# Patient Record
Sex: Male | Born: 1995 | Race: White | Marital: Single | Smoking: Never smoker
Health system: Northeastern US, Academic
[De-identification: ages and names within clinical notes are randomized; demographics above are authoritative.]

## PROBLEM LIST (undated history)

## (undated) DIAGNOSIS — G801 Spastic diplegic cerebral palsy: Secondary | ICD-10-CM

## (undated) DIAGNOSIS — K649 Unspecified hemorrhoids: Secondary | ICD-10-CM

## (undated) HISTORY — DX: Spastic diplegic cerebral palsy: G80.1

## (undated) HISTORY — DX: Unspecified hemorrhoids: K64.9

---

## 2016-06-05 ENCOUNTER — Encounter: Payer: Self-pay | Admitting: Gastroenterology

## 2016-09-16 ENCOUNTER — Inpatient Hospital Stay: Admit: 2016-09-16 | Discharge: 2016-09-16 | Disposition: A | Discharge status: Home / Self Care | Payer: Self-pay

## 2017-05-19 ENCOUNTER — Encounter: Payer: Self-pay | Admitting: Gastroenterology

## 2017-08-20 ENCOUNTER — Telehealth: Payer: Self-pay

## 2017-08-20 ENCOUNTER — Encounter: Payer: Self-pay | Admitting: Gastroenterology

## 2017-08-20 NOTE — Telephone Encounter (Signed)
[facility]. Placed on referral table.

## 2017-12-03 ENCOUNTER — Other Ambulatory Visit
Admission: RE | Admit: 2017-12-03 | Discharge: 2017-12-03 | Disposition: A | Discharge status: Home / Self Care | Payer: Commercial Managed Care - PPO | Source: Ambulatory Visit | Attending: Neurology | Admitting: Neurology

## 2017-12-03 ENCOUNTER — Encounter: Payer: Self-pay | Admitting: Neurology

## 2017-12-03 ENCOUNTER — Ambulatory Visit: Payer: Commercial Managed Care - PPO | Attending: Neurology | Admitting: Neurology

## 2017-12-03 VITALS — BP 140/90 | HR 69 | Ht 60.0 in | Wt 126.0 lb

## 2017-12-03 DIAGNOSIS — G20 Parkinson's disease: Secondary | ICD-10-CM

## 2017-12-03 LAB — CBC AND DIFFERENTIAL
Baso # K/uL: 0.1 10*3/uL (ref 0.0–0.1)
Basophil %: 1 %
Eos # K/uL: 0.2 10*3/uL (ref 0.0–0.5)
Eosinophil %: 2.6 %
Hematocrit: 51 % (ref 40–51)
Hemoglobin: 17 g/dL (ref 13.7–17.5)
IMM Granulocytes #: 0 10*3/uL
IMM Granulocytes: 0.2 %
Lymph # K/uL: 1.5 10*3/uL (ref 1.3–3.6)
Lymphocyte %: 19 %
MCH: 29 pg/cell (ref 26–32)
MCHC: 33 g/dL (ref 32–37)
MCV: 88 fL (ref 79–92)
Mono # K/uL: 0.4 10*3/uL (ref 0.3–0.8)
Monocyte %: 4.7 %
Neut # K/uL: 5.8 10*3/uL — ABNORMAL HIGH (ref 1.8–5.4)
Nucl RBC # K/uL: 0 10*3/uL (ref 0.0–0.0)
Nucl RBC %: 0 /100 WBC (ref 0.0–0.2)
Platelets: 227 10*3/uL (ref 150–330)
RBC: 5.8 MIL/uL (ref 4.6–6.1)
RDW: 12.5 % (ref 11.6–14.4)
Seg Neut %: 72.5 %
WBC: 8 10*3/uL (ref 4.2–9.1)

## 2017-12-03 LAB — COMPREHENSIVE METABOLIC PANEL
ALT: 18 U/L (ref 0–50)
AST: 20 U/L (ref 0–50)
Albumin: 5.3 g/dL — ABNORMAL HIGH (ref 3.5–5.2)
Alk Phos: 72 U/L (ref 40–130)
Anion Gap: 13 (ref 7–16)
Bilirubin,Total: 0.3 mg/dL (ref 0.0–1.2)
CO2: 28 mmol/L (ref 20–28)
Calcium: 9.9 mg/dL (ref 9.3–10.5)
Chloride: 100 mmol/L (ref 96–108)
Creatinine: 0.8 mg/dL (ref 0.67–1.17)
GFR,Black: 147 *
GFR,Caucasian: 127 *
Glucose: 92 mg/dL (ref 60–99)
Lab: 15 mg/dL (ref 6–20)
Potassium: 4.1 mmol/L (ref 3.3–5.1)
Sodium: 141 mmol/L (ref 133–145)
Total Protein: 8.1 g/dL — ABNORMAL HIGH (ref 6.3–7.7)

## 2017-12-03 LAB — CERULOPLASMIN: Ceruloplasmin: 25 mg/dL (ref 15–30)

## 2017-12-03 LAB — T4, FREE: Free T4: 1.5 ng/dL (ref 0.9–1.7)

## 2017-12-03 LAB — TSH: TSH: 0.9 u[IU]/mL (ref 0.27–4.20)

## 2017-12-03 LAB — VITAMIN B12: Vitamin B12: 1396 pg/mL — ABNORMAL HIGH (ref 232–1245)

## 2017-12-03 LAB — SEDIMENTATION RATE, AUTOMATED: Sedimentation Rate: 7 mm/hr (ref 0–15)

## 2017-12-04 ENCOUNTER — Encounter: Payer: Self-pay | Admitting: Neurology

## 2017-12-04 LAB — ANTI RNP/SMITH
Anti-RNP: <0.2 AI (ref 0.0–0.9)
Anti-Smith: <0.2 AI (ref 0.0–0.9)
SM/RNP AB: <0.2 AI (ref 0.0–0.9)

## 2017-12-04 LAB — ANTI-SSA/SSB
Anti-LA/SS-B: <0.2 AI (ref 0.0–0.9)
Anti-RO/SS-A: <0.2 AI (ref 0.0–0.9)

## 2017-12-04 LAB — CYCLIC CITRULLINATED PEPTIDE: Cyclic Citrullin Peptide Ab: <0.5 U/mL (ref 0.0–2.9)

## 2017-12-04 LAB — RHEUMATOID FACTOR,SCREEN: Rheumatoid Factor: <10 IU/mL

## 2017-12-04 LAB — ANCA SCREEN: ANCA Screen: NEGATIVE

## 2017-12-04 LAB — UNABLE TO PERFORM ADD-ON TESTING 1

## 2017-12-04 LAB — ANTI DS-DNA AB: dsDNA Ab: <1 IU/mL (ref 0–4)

## 2017-12-04 LAB — ANTINUCLEAR ANTIBODY SCREEN: ANA Screen: NEGATIVE

## 2017-12-04 NOTE — Progress Notes (Signed)
NEUROLOGY Clinic Consult Note    Referring Provider: Molinda Bailiff, MD    Reason for consult: chronic pain  History of Present Illness:      Thank you for referring Corey Rodriguez for evaluation at my [facility] on 12/04/2017    Corey Rodriguez is a 22 y.o.  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  Nonetheless, he apparently had fairly normal development per his mother, with minimally delayed ambulation and speech.  He did partake in special education, but graduated high school and obtained an associate two-year college degree.  At his baseline he has been ambulatory without aid, with non-impairing leg spasticity and mild dysarthria.  His VP shunt has been revised 3 times, last in 2010 at [facility]).    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  He describes achy, burning, and stabbing pain in all 4 limbs, as well as in his neck and thoracic/lumbar regions.  The pain is constant throughout the day, exacerbating by any limb movement and almost all physical activity, especially walking.  He has no involvement of face or head, and does not have joint pain.  His muscles are not tender to his touch.      He does not note any change of his chronic leg spasticity, does not think his arms are particularly siff.  He does not describe hyperekplexia or any limb spasms.  There are no other sensory sx (paresthesias,  numbness), urinary/bowel sx, new muscle weakness, new reduced manual dexterity.  He has had very impaired sleep due to pain.  Deniies skin changes, vision changes, dry mouth/eyes, ulcers of mouth/genitals.    He is on sumatriptan 100 mg prn for once weekly episodicmigraine, characterized by diffuse throbbing pain, w/o focal aura and w/o nausea, photophobia,  phonophobia, or autonomic sx.  He also has hx of chronic upper limb tremor, which impairs his writing, holding liquids, and not present at rest.  He formerly used alcohol heavily, but noted no effect of alcohol on tremor.  He apparently had been treated in the past with propranolol, w/o much benefit. His MGM had tremor at a late age, but no other fhx of tremor, parkinson's.  He is not seeking tx for tremor today.    Regarding pain, he has been treated with several agents, including amitriptyline (no help), medical marijuana, CBD, gabapentin, cymbalta.  Also has been treated with abilify for mood, but this was stopped 2 months ago.  Has never been on venlafaxine, pregabalin.  Currently still on gabapentin, cymbalta, and MM.          PMH  Past Medical History:   Diagnosis Date    Hemorrhoids     Spastic cerebral palsy    vitamin d deficiency    PSH  Past Surgical History:   Procedure Laterality Date    VENTRICULOPERITONEAL SHUNT, 3 revisions           Medications:   Prior to Admission medications    Medication Sig Start Date End Date Taking? Authorizing Provider   DULoxetine (CYMBALTA) 60 MG capsule  11/10/17   [provider]   ERGOCALCIFEROL 50000 units capsule Take 1 capsule by mouth once a week 11/10/17   [provider]   GABAPENTIN 800 MG tablet  Take 800 mg by mouth 3 times daily 11/04/17   [provider]              ALLERGIES:    No Known Allergies (drug, envir, food or latex)         FH  Family History   Problem Relation Age of Onset    High cholesterol Mother     Migraines Father     High cholesterol Father     Tremors Maternal Grandmother     Migraines Paternal Grandmother          SOCIAL HISTORY  Social History     Social History    Marital status: Single     Spouse name: N/A    Number of children: N/A    Years of education: N/A     Social History Main Topics    Smoking status: Never Smoker    Smokeless tobacco: Never Used    Alcohol use No      Comment: He drank  heavily for 3-4 years from [age], stopped 2018.    Drug use: Unknown     Types: Marijuana    Sexual activity: Not Asked     Other Topics Concern    None     Social History Narrative    None       ROS       Review of Systems:    CONSTITUTIONAL: Appetite good, no fevers, night sweats or weight loss  EYES: No visual changes, no eye pain; no dry eyes/dry mouth  ENT: bilateral tinnitus, but no hl.  CV: No chest pain, shortness of breath or peripheral edema  RESPIRATORY: No cough, wheezing or dyspnea  GI: hx of hemorrhoids w/BRBPR.  No nausea/vomiting, abdominal pain, or change in bowel habits  GU: No dysuria, urgency or incontinence  MS: No joint pain/swelling.  SKIN: No rashes or ulcers  PSYCH: hx of depression  ENDOCRINE: No diabetes or thyroid disease  HEME/LYMPH: No easy bleeding/bruising or swollen nodes          Physical Exam  BP 140/90    Pulse 69    Ht 1.524 m (5')    Wt 57.2 kg (126 lb)    BMI 24.61 kg/m     Examination:  General: WD/WN male, NAD.  HEENT: not macrocephalic.  No obvious k-f rings  Neck:  ?extensor posturing.  Diffuse trigger points of muscles in all 4 limbs.    Neurologic Examination:      Mental status: awake, alert and fully oriented.  No language deficit.  Follows simple, left-right, multistepped commands.  No apraxia or neglect.  Attention, concentration and memory are grossly intact.  Higher cognitive functions are generally normal.      Cranial nerves: Pupils 4/4 to 2/2.   There is no afferent pupillary defect.  Visual fields are full.  Visual acuity  not tested.  Fundi sharp without papilledema or hemorrhage.  Extraocular movements full without nystagmus.  Facial sensation  intact bilaterally to PP/LT  in all three trigeminal divisions.  +facial hypomimia.Marland Kitchen  Hearing is intact to finger rub bilaterally.  Palate/uvula with midline elevation.  Gag intact.  Shoulder shrug  5/5. Tongue midline without atrophy, weakness, or fasciculations.  Mild pseudobulbar dysarhria.    Motor:      UE  (R/L)- SA 5/5  EF 5/5  EE 5/5 WE 5/5  WF 5/5  FE 4/4 FF 5/5  Intr 4/4.  I    LE (R/L)-  HF 5/5  KE 5/5 KF 5/5 FDF 5/5  FPF 5/5.  Mild bilateral UE postural tremor, high frequency/low amplitude.  No orbiting.  Reduced amplitude finger tapping bilaterally.  THere is mild upper limb cogwheeling and also mild bilateral LE spasticity.      No provoked mmuscle spasms, no hyperekplexia.    Sensory:  intact to pain, light touch, vibration, and proprioception.  Romberg negative. There is no extinction to double simultaneous stimulation.      Gait spastic, mildly paraparetic.  Able to tandem with difficulty.    Coordination : no ataxia w/ finger-nose-finger, heel-to-shin and rapid alternating movements bilaterally.  Did not assess handwriting/spiral.    Reflexes: sdiffusely brisk, unsustained ankle clonus bilaterally, bilateral Babinskis.          Labs:            Imaging:            IMPRESSION    I do not think his chronic pain is neuroogic in origin, and there are features suggestive of fibromyalgia (multiple trigger points, poor sleep, mood dysphoria), although this is unusual for a young man.  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  I suspect this is all related to his longstanding static encephalopathy (he does not c/o of gait change, new limb stiffness) and appears to have lonstanding essential tremor.  However, on limited notes from his neuroogist, do not see parkinsonian signs on exam.  He was on abilify briefly few months, he says), and this was stopped about 2 months ago.  Suppose parkinsonsm may still be drug-related (his chronic brain disease places him at more risk for drug-induced parkinsonism, which may take months to resolve).  Nonetheless will get testing for Wilson's (serum copper/ceruloplasmin, 24-hour urinary copper).   There is no fhx of WD, but this is autosomal recessive.    I do not think parkinsonism explains his  pain.  There are no features of stiff person syndrome (hyperekplexia, provoked spasms), though he does have some cervical lordosis.     He also has essential tremor.  Can try either propranolol or primidone but this seems less imprtant to him right now.  He does not have any sx/signs of peripheral neuropathy.    RECOMMENDATIONS:    Getting autoimmune labs, including  anti-GAD65/ampiphysin.  He is scheduled for brain MRI through his neurologis in [place].  Will get those images.  No intervention made today.  Reassess 6-8 weeks.      Thank you again for allowing me to participate in the care of Corey Rodriguez .   I can be reached at [email or web address] or [phone] with questions.

## 2017-12-05 ENCOUNTER — Encounter: Payer: Self-pay | Admitting: Gastroenterology

## 2017-12-05 LAB — COPPER, SERUM: Copper: 121 ug/dL (ref 70–140)

## 2017-12-06 ENCOUNTER — Inpatient Hospital Stay: Admit: 2017-12-06 | Discharge: 2017-12-06 | Disposition: A | Discharge status: Home / Self Care | Payer: Self-pay

## 2017-12-10 LAB — ENCEPHALOPATHY-AUTOIMMUNE EVAL,SER
AChR Ganglionic Neuronal: 0 nmol/L (ref <=0.02)
AMPA-R AB CBBA: NEGATIVE
Acetylcholine Rec Bind: 0 nmol/L (ref <=0.02)
Amphiphysin AB: NEGATIVE titer
CASPR2-IgG CBA,S: NEGATIVE
CRMP-5 IgG: NEGATIVE titer
GABA-B-R AB CBA: NEGATIVE
Glial Nuclear,Type 1: NEGATIVE titer
Glutamic Acid Decarboxylase AB: 0 nmol/L (ref <=0.02)
LGl1-IgG CBA,S: NEGATIVE
N-Type CA Channel Ab: 0 nmol/L (ref <=0.03)
NMDA-R AB CBA: NEGATIVE
NNA-1: NEGATIVE titer
NNA-2: NEGATIVE titer
NNA-3: NEGATIVE titer
Neuronal (V-G)K+ Channel: 0 nmol/L (ref <=0.02)
P/Q Type CA Channel AB: 0 nmol/L (ref <=0.02)
PCA Type 2: NEGATIVE titer
PCA Type TR: NEGATIVE titer
PCA, Type 1: NEGATIVE titer

## 2018-01-21 ENCOUNTER — Encounter: Payer: Self-pay | Admitting: Neurology

## 2018-01-21 ENCOUNTER — Ambulatory Visit: Payer: Commercial Managed Care - PPO | Attending: Neurology | Admitting: Neurology

## 2018-01-21 VITALS — BP 132/70 | HR 103 | Ht 60.0 in | Wt 135.0 lb

## 2018-01-21 DIAGNOSIS — F329 Major depressive disorder, single episode, unspecified: Secondary | ICD-10-CM

## 2018-01-21 DIAGNOSIS — M797 Fibromyalgia: Secondary | ICD-10-CM

## 2018-01-21 DIAGNOSIS — G20 Parkinson's disease: Secondary | ICD-10-CM

## 2018-01-21 DIAGNOSIS — F32A Depression, unspecified: Secondary | ICD-10-CM

## 2018-01-21 NOTE — Progress Notes (Signed)
NEUROLOGY CLINIC FOLLOW-UP NOTE    Reason for visit:    Interval History    [name] returns for follow-up today.  Continues to c/o whle body achy>burning pain.  His migraines are well controlled.  He does not c/o limb stiffness, change in his longstanding tremor, new speech or gait sx.  He has had no falls.    Unfortunately, he was admitted to [facility] with suidcidal ideations in June, primarily because of his pain.  Made several med changes.  He has no suicidal ideation now.  He is following with psychiatrist in [place].  Has had depression for about 2 years, since he developed his pain.    Labs here from May were all normal, including annti-GAD65.   Did not receive any resuslts of 24-hour urinary copper.  PMH:  Past Medical History:   Diagnosis Date    Hemorrhoids     Spastic cerebral palsy        .Meds:      ROS   A 16 point ROS was reviewed with patient and was otherwise unremarkable, save for the following:    as per hpi          Physical Exam  BP 132/70    Pulse 103    Ht 1.524 m (5')    Wt 61.2 kg (135 lb)    BMI 26.37 kg/m      General exam is unremarkable.  No obvious KF rings.  He has diffuse trigger points in muscles of all 4 limbs.    Neurological Examination:    Mental status:  fully alert and oriented; language intact.  Grossly normal cognition.  Mood/affect depressed.  NO SI.      Cranial nerves:  significant for facial hypomimia.    Motor:  power 5/5 throughout; there is mild cogwheeling of both upper limbs, L>R.  No resting tremor.  Postual tremor seems less pronounced today.  There is no orbiting.  Finger taps are bilaterally reduced.  There is very mild bradykinesia.     Sensory:  Vibration 21-22 sec at greeat toes, 21 sec at idex fingers.  Romberg negative.    Gait:  spastic, with reduced arm swing bilaterally.  He turns well, no en bloc.  There is 1-step retropulsion on pull test.    Coordination:  FNF is non-ataxic.    DTR:  diffusely brisk with bilateral  Babinskis.          Labs:      Hospital Outpatient Visit on 12/03/2017   Component Date Value Ref Range Status    WBC 12/03/2017 8.0  4.2 - 9.1 THOU/uL Final    RBC 12/03/2017 5.8  4.6 - 6.1 MIL/uL Final    Hemoglobin 12/03/2017 17.0  13.7 - 17.5 g/dL Final    Hematocrit 12/03/2017 51  40 - 51 % Final    MCV 12/03/2017 88  79 - 92 fL Final    MCH 12/03/2017 29  26 - 32 pg/cell Final    MCHC 12/03/2017 33  32 - 37 g/dL Final    RDW 12/03/2017 12.5  11.6 - 14.4 % Final    Platelets 12/03/2017 227  150 - 330 THOU/uL Final    Seg Neut % 12/03/2017 72.5  % Final    Lymphocyte % 12/03/2017 19.0  % Final    Monocyte % 12/03/2017 4.7  % Final    Eosinophil % 12/03/2017 2.6  % Final    Basophil % 12/03/2017 1.0  % Final  Neut # K/uL 12/03/2017 5.8* 1.8 - 5.4 THOU/uL Final    Lymph # K/uL 12/03/2017 1.5  1.3 - 3.6 THOU/uL Final    Mono # K/uL 12/03/2017 0.4  0.3 - 0.8 THOU/uL Final    Eos # K/uL 12/03/2017 0.2  0.0 - 0.5 THOU/uL Final    Baso # K/uL 12/03/2017 0.1  0.0 - 0.1 THOU/uL Final    Nucl RBC % 12/03/2017 0.0  0.0 - 0.2 /100 WBC Final    Nucl RBC # K/uL 12/03/2017 0.0  0.0 - 0.0 THOU/uL Final    IMM Granulocytes # 12/03/2017 0.0  THOU/uL Final    IMM Granulocytes 12/03/2017 0.2  % Final    Sodium 12/03/2017 141  133 - 145 mmol/L Final    Potassium 12/03/2017 4.1  3.3 - 5.1 mmol/L Final    Chloride 12/03/2017 100  96 - 108 mmol/L Final    CO2 12/03/2017 28  20 - 28 mmol/L Final    Anion Gap 12/03/2017 13  7 - 16 Final    UN 12/03/2017 15  6 - 20 mg/dL Final    Creatinine 12/03/2017 0.80  0.67 - 1.17 mg/dL Final    GFR,Caucasian 12/03/2017 127  * Final    GFR,Black 12/03/2017 147  * Final    Glucose 12/03/2017 92  60 - 99 mg/dL Final    Calcium 12/03/2017 9.9  9.3 - 10.5 mg/dL Final    Total Protein 12/03/2017 8.1* 6.3 - 7.7 g/dL Final    Albumin 12/03/2017 5.3* 3.5 - 5.2 g/dL Final    Bilirubin,Total 12/03/2017 0.3  0.0 - 1.2 mg/dL Final    AST 12/03/2017 20  0 - 50 U/L Final     ALT 12/03/2017 18  0 - 50 U/L Final    Alk Phos 12/03/2017 72  40 - 130 U/L Final    TSH 12/03/2017 0.90  0.27 - 4.20 uIU/mL Final    Free T4 12/03/2017 1.5  0.9 - 1.7 ng/dL Final    Vitamin B12 12/03/2017 1396* 232 - 1,245 pg/mL Final    Rheumatoid Factor 12/03/2017 <10  IU/mL Final    Sedimentation Rate 12/03/2017 7  0 - 15 mm/hr Final    ANA Screen 12/03/2017 NEG  NEGATIVE Final    Anti-RNP 12/03/2017 <0.2  0.0 - 0.9 AI Final    Anti-Smith 12/03/2017 <0.2  0.0 - 0.9 AI Final    SM/RNP AB 12/03/2017 <0.2  0.0 - 0.9 AI Final    dsDNA Ab 12/03/2017 <1  0 - 4 IU/mL Final    ANCA Screen 12/03/2017 NEG  NEGATIVE Final    Anti-RO/SS-A 12/03/2017 <0.2  0.0 - 0.9 AI Final    Anti-LA/SS-B 12/03/2017 <0.2  0.0 - 0.9 AI Final    Cyclic Citrullin Peptide Ab 12/03/2017 <0.5  0.0 - 2.9 U/mL Final    Interpretation,ENCES 12/03/2017 see below   Final    NMDA-R AB CBA 12/03/2017 Negative  Negative Final    Neuronal (V-G)K+ Channel 12/03/2017 0.00  <=0.02 nmol/L Final    LGl1-IgG CBA,S 12/03/2017 Negative  Negative Final    CASPR2-IgG CBA,S 12/03/2017 Negative  Negative Final    Glutamic Acid Decarboxylase AB 12/03/2017 0.00  <=0.02 nmol/L Final    GABA-B-R AB CBA 12/03/2017 Negative  Negative Final    AMPA-R AB CBBA 12/03/2017 Negative  Negative Final    NNA-1 12/03/2017 Negative  <1:240 titer Final    NNA-2 12/03/2017 Negative  <1:240 titer Final    Reflex Added 12/03/2017 None.  Final    NNA-3 12/03/2017 Negative  <1:240 titer Final    Glial Nuclear,Type 1 12/03/2017 Negative  <1:240 titer Final    PCA, Type 1 12/03/2017 Negative  <1:240 titer Final    PCA Type 2 12/03/2017 Negative  <1:240 titer Final    PCA Type TR 12/03/2017 Negative  <1:240 titer Final    Amphiphysin AB 12/03/2017 Negative  <1:240 titer Final    N-Type CA Channel Ab 12/03/2017 0.00  <=0.03 nmol/L Final    P/Q Type CA Channel AB 12/03/2017 0.00  <=0.02 nmol/L Final    Acetylcholine Rec Bind 12/03/2017 0.00  <=0.02 nmol/L  Final    AChR Ganglionic Neuronal 12/03/2017 0.00  <=0.02 nmol/L Final    CRMP-5 IgG 12/03/2017 Negative  <1:240 titer Final    Copper 12/03/2017 121  70 - 140 ug/dL Final    Ceruloplasmin 12/03/2017 25  15 - 30 mg/dL Final    Unable to Perform Add-on Testing 12/03/2017 SEE COMMENT   Final         Imaging:            IMPRESSION    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  I do not think this is related to his pain.  He does not appear to have dysfunction from parkinsonism.  He has longstanding spastic CP for which he has had excellent adaptation.  Based on his pain hx and multiple muscle trigger points, he does seem to have fibromyalgia. His depression appears to be related to this.  I think venlafaxine dosage shold be increased to address both his mood and pain, and will defer this to  his PCP and psychiatrist.  I will f/u on his 24-hour urinary copper and recent brain mri.  His episodic migraine responds to sumatriptan.          RECOMMENDATIONS:      f/u in six months to monitor parkinsonism.        Thank you again for allowing me to participate in the care of Corey Rodriguez .

## 2018-01-21 NOTE — Patient Instructions (Signed)
1.  I will speak with you when I receive your brain MRI and 24-hour urine results.  2.  Have your primary refer you to rheumatologist for fibromyalgia.  3.  Follow up with me in six months.  4.  Would suggest tryiing to increase venlafaxine to 75 mg.spastic

## 2018-01-29 ENCOUNTER — Encounter: Payer: Self-pay | Admitting: Neurology

## 2018-01-29 NOTE — Telephone Encounter (Signed)
Error

## 2018-02-02 ENCOUNTER — Telehealth: Payer: Self-pay | Admitting: Neurology

## 2018-02-02 NOTE — Telephone Encounter (Signed)
Received Copper, Urine lab collected 12/05/17. Placed in Lindsay's mailbox for review.

## 2018-02-02 NOTE — Telephone Encounter (Signed)
Scanned into Dr. Samkoffs eFolder and sent to 24 hour scanning.

## 2018-02-13 ENCOUNTER — Telehealth: Payer: Self-pay | Admitting: Neurology

## 2018-02-13 NOTE — Telephone Encounter (Signed)
-----   Message from Elroy ChannelLawrence M Samkoff, MD sent at 01/21/2018  8:37 AM EDT -----  Victorino DikeHello    Braelyn also had a recent brain MRI through [facility] in [place]..  Can we get images and report?    THanks.    LS

## 2018-02-13 NOTE — Telephone Encounter (Signed)
Call[facility] and [facility] with [facility]- they are sending this MRI along with MRI from 2018 on disk (and the reports)

## 2018-02-19 ENCOUNTER — Telehealth: Payer: Self-pay | Admitting: Neurology

## 2018-02-19 NOTE — Telephone Encounter (Signed)
AC1 received cd for scanning  Taking to [facility] room G-[id number]

## 2018-02-22 ENCOUNTER — Other Ambulatory Visit: Payer: Self-pay

## 2018-02-22 ENCOUNTER — Other Ambulatory Visit: Payer: Self-pay | Admitting: Gastroenterology

## 2018-02-24 NOTE — Telephone Encounter (Signed)
CD returned to patient by mail after scanning complete.

## 2018-07-24 ENCOUNTER — Encounter: Payer: Self-pay | Admitting: Neurology

## 2018-07-24 NOTE — Telephone Encounter (Signed)
Left second voicemail and mailed letter as 1/8 is approaching

## 2018-07-27 NOTE — Telephone Encounter (Signed)
Patient is no longer being seen by the [facility]. He is no longer in the [place].

## 2018-07-27 NOTE — Telephone Encounter (Addendum)
Another voicemail left 1/6. Also left a voicemail for his mother, Clydie Braun as she is listed in the [place].

## 2018-07-29 ENCOUNTER — Ambulatory Visit: Payer: Commercial Managed Care - PPO | Admitting: Neurology

## 2018-08-12 ENCOUNTER — Ambulatory Visit: Payer: Commercial Managed Care - PPO | Admitting: Neurology

## 2019-07-29 IMAGING — CR T-SPINE 3 VWS
1 series · 3 of 3 positions shown · non-contrast
Comparison: None

HISTORY: Cerebral palsy
TECHNIQUE: Thoracic spine 3 views

[Series 1: swimmers · 0.17mm/px · 3 of 3 slices shown]
[im 1/3]
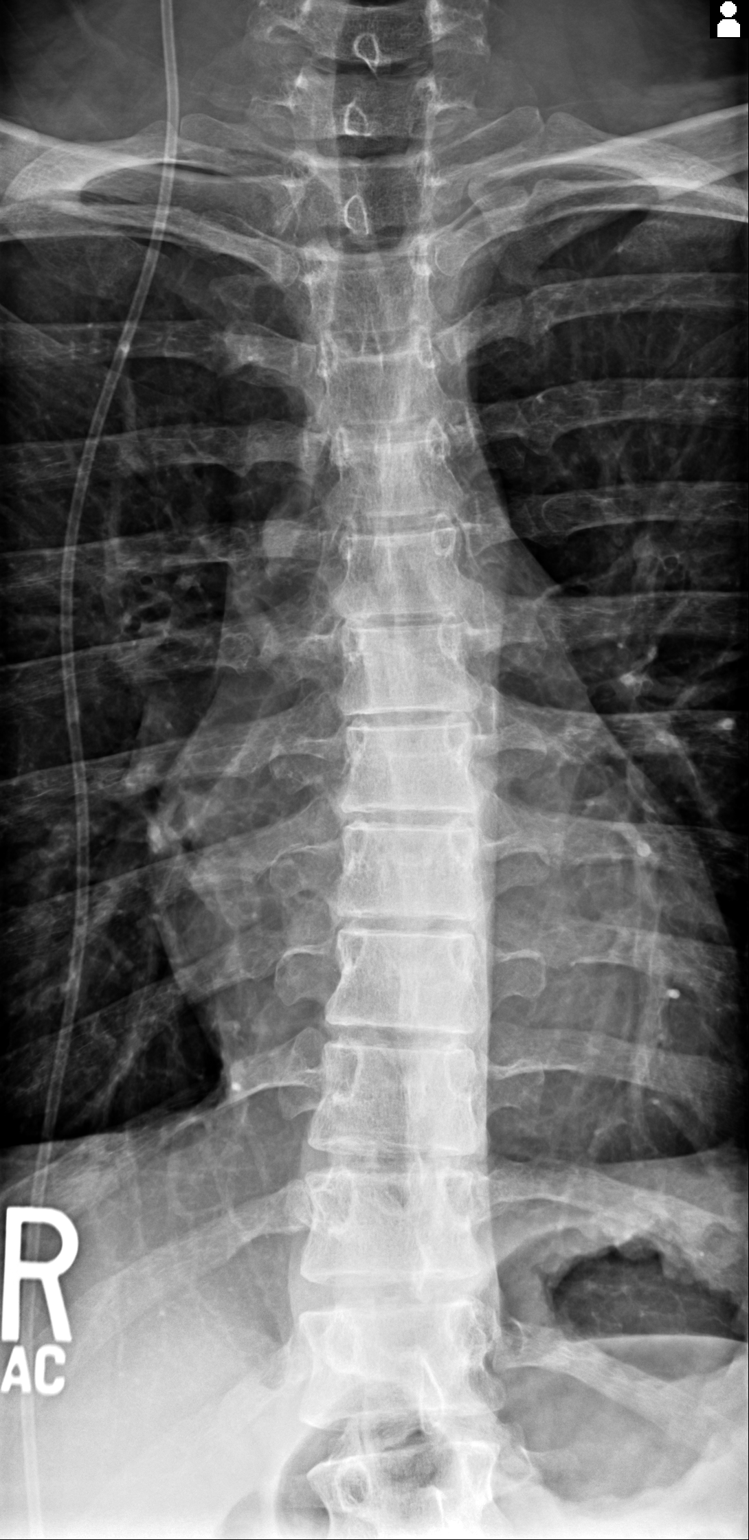
[im 2/3]
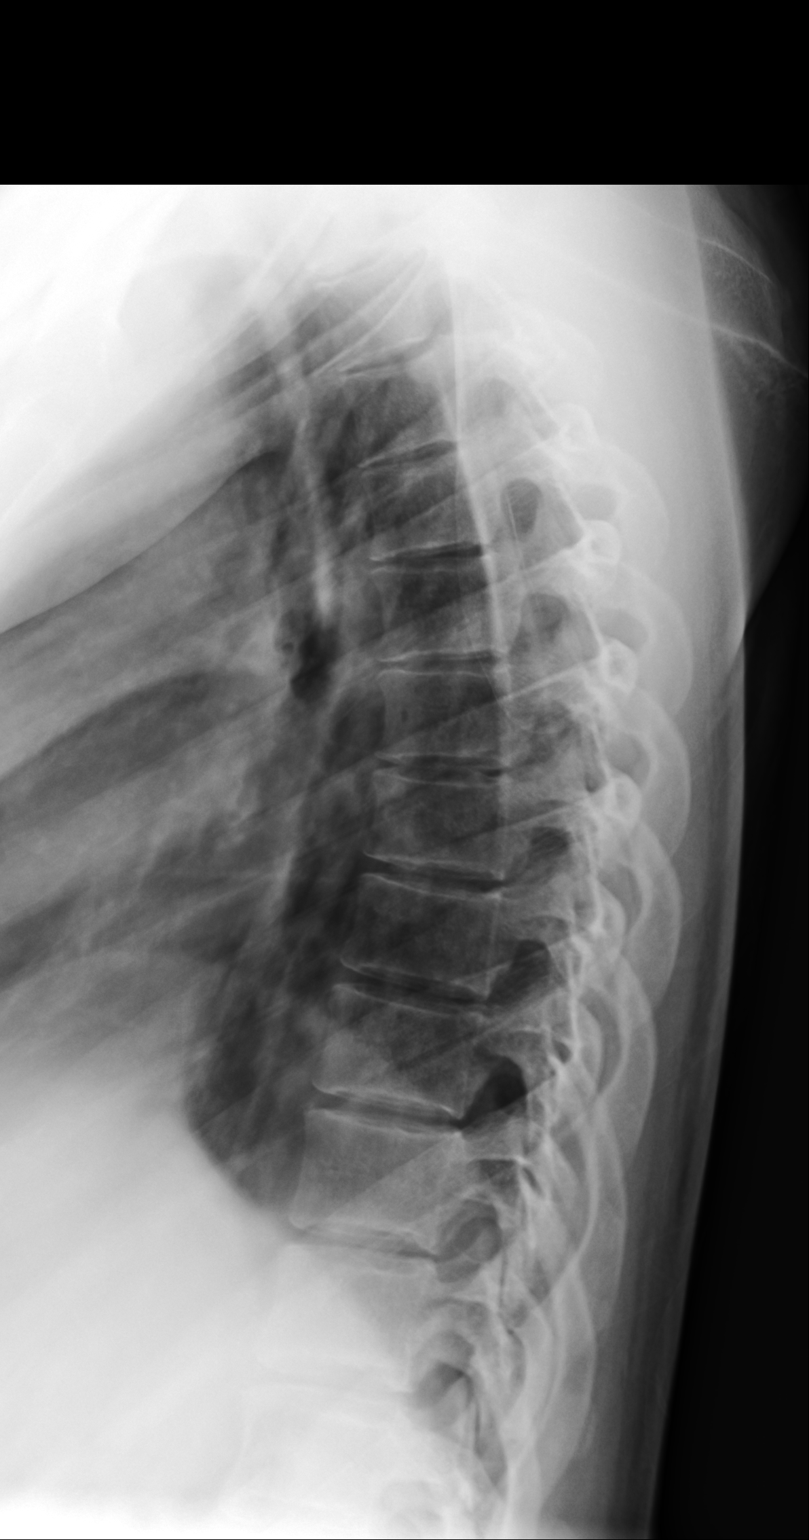
[im 3/3]
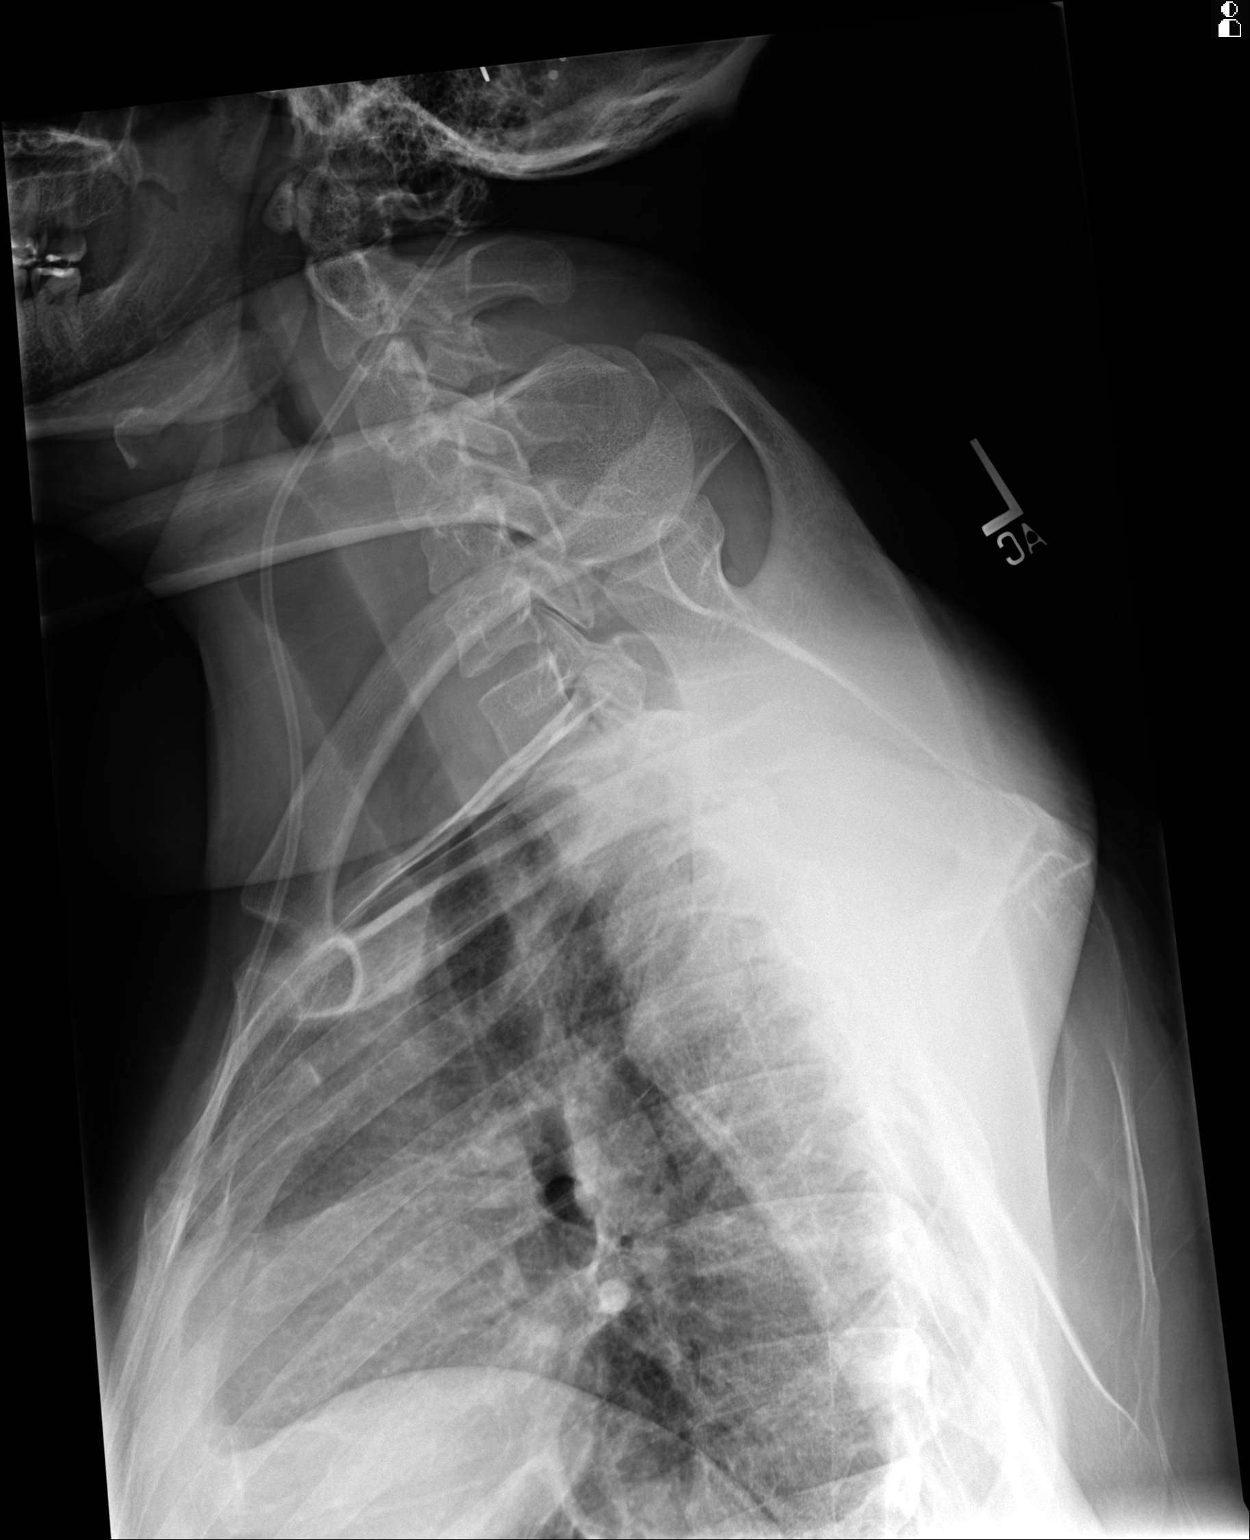

[3 of 3 positions shown; findings below may reference images not displayed]

FINDINGS: 3 views of the thoracic spine demonstrate mild thoracic levoscoliosis. No anterolisthesis or retrolisthesis. No fracture or destructive lesion. Disc spaces and facet joints appear adequately maintained. Ventricular peritoneal shunt noted.
IMPRESSION: Mild thoracic levoscoliosis. No fractures. No destructive lesions. No visible anomalous segments.

## 2019-07-29 IMAGING — CR L-SPINE 4 VWS MIN
1 series · 5 of 5 positions shown · non-contrast
Comparison: None

HISTORY: Cerebral palsy
TECHNIQUE: Lumbar spine 5 views

[Series 1: oblique · 0.17mm/px · 5 of 5 slices shown]
[im 1/5]
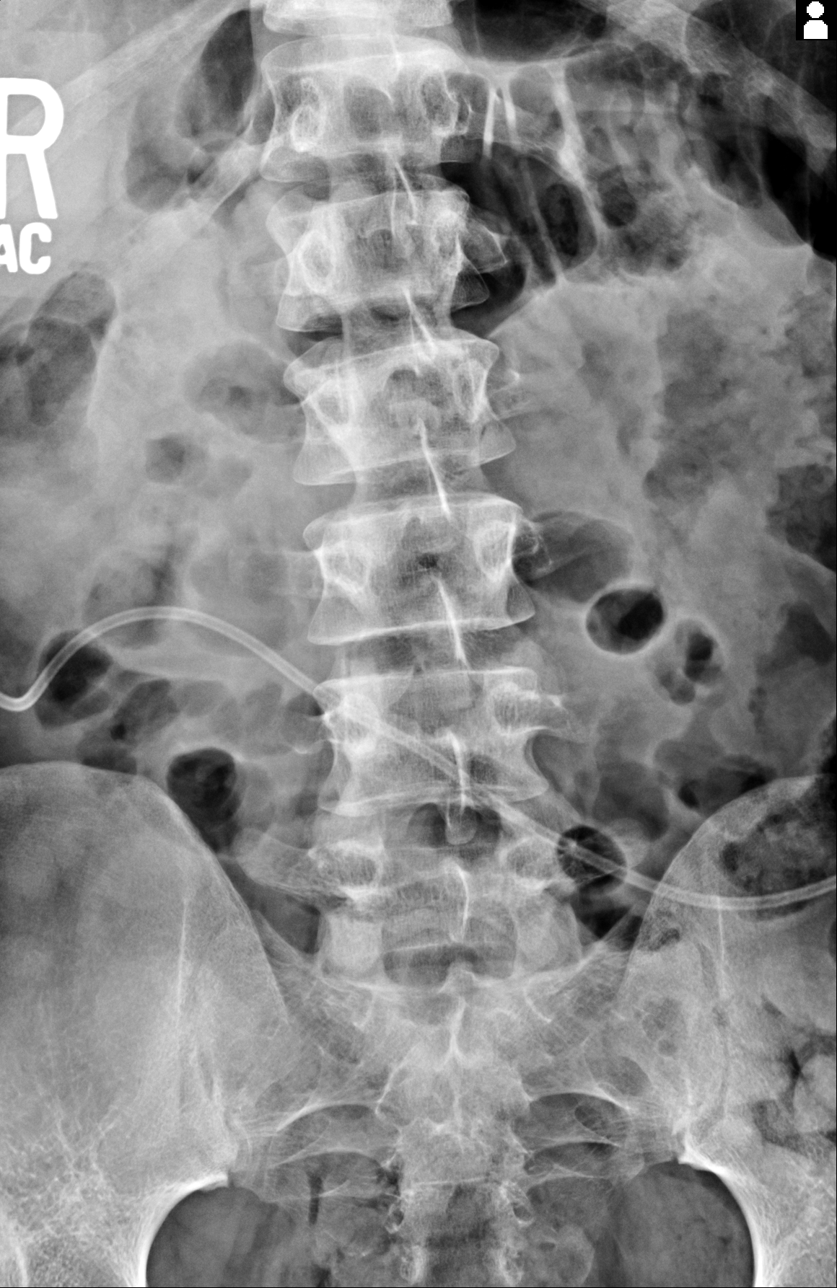
[im 2/5]
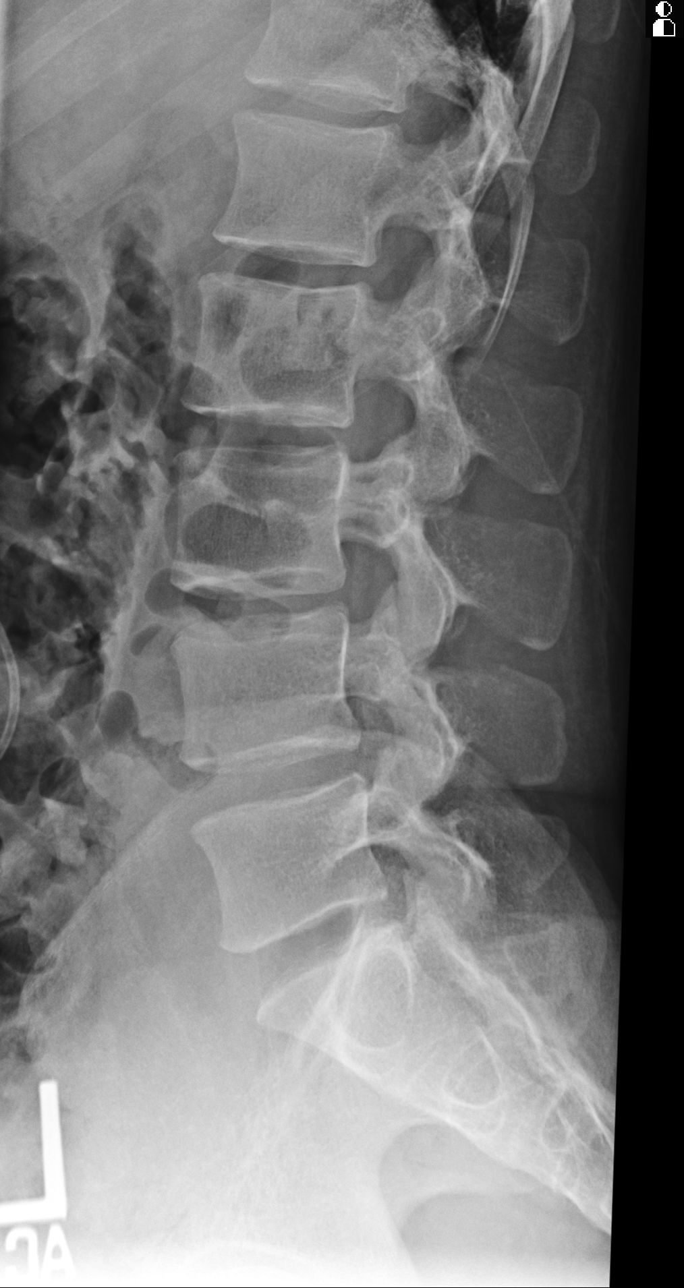
[im 3/5]
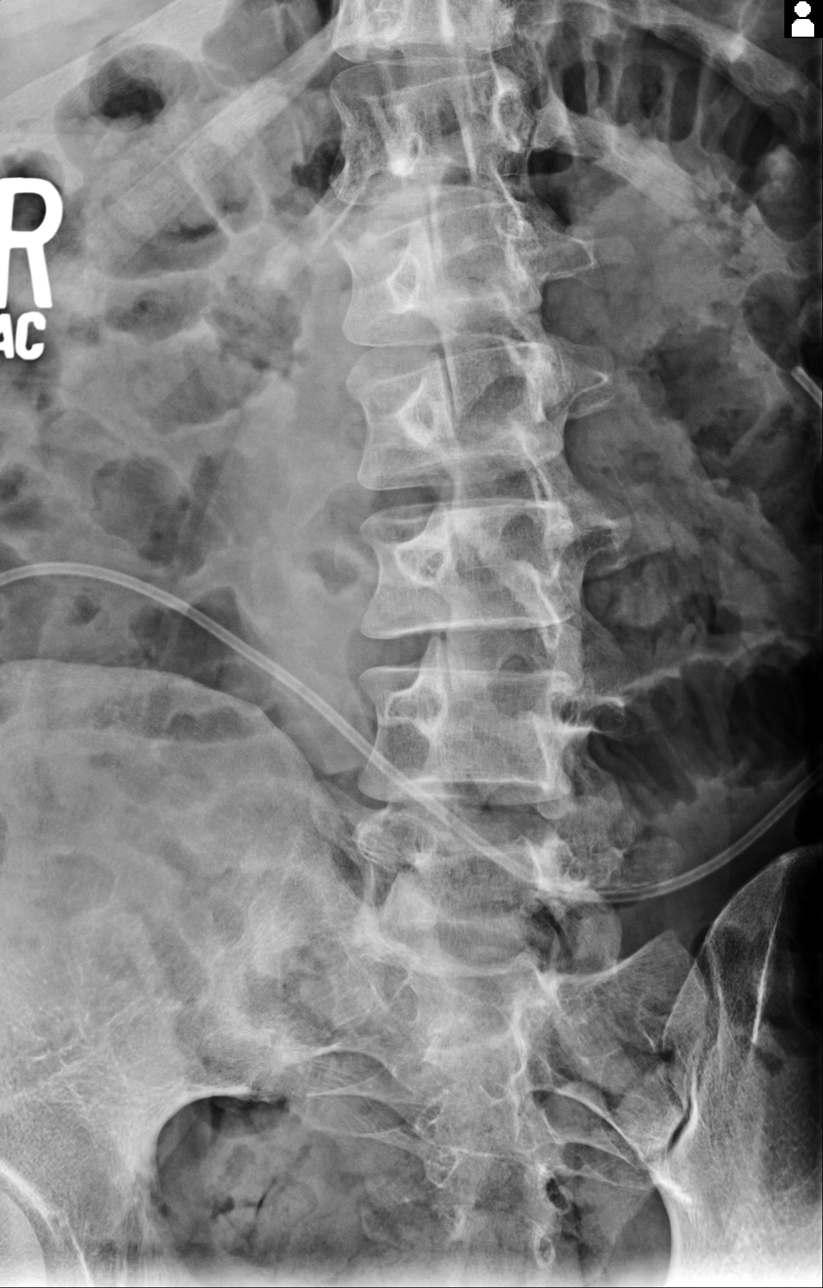
[im 4/5]
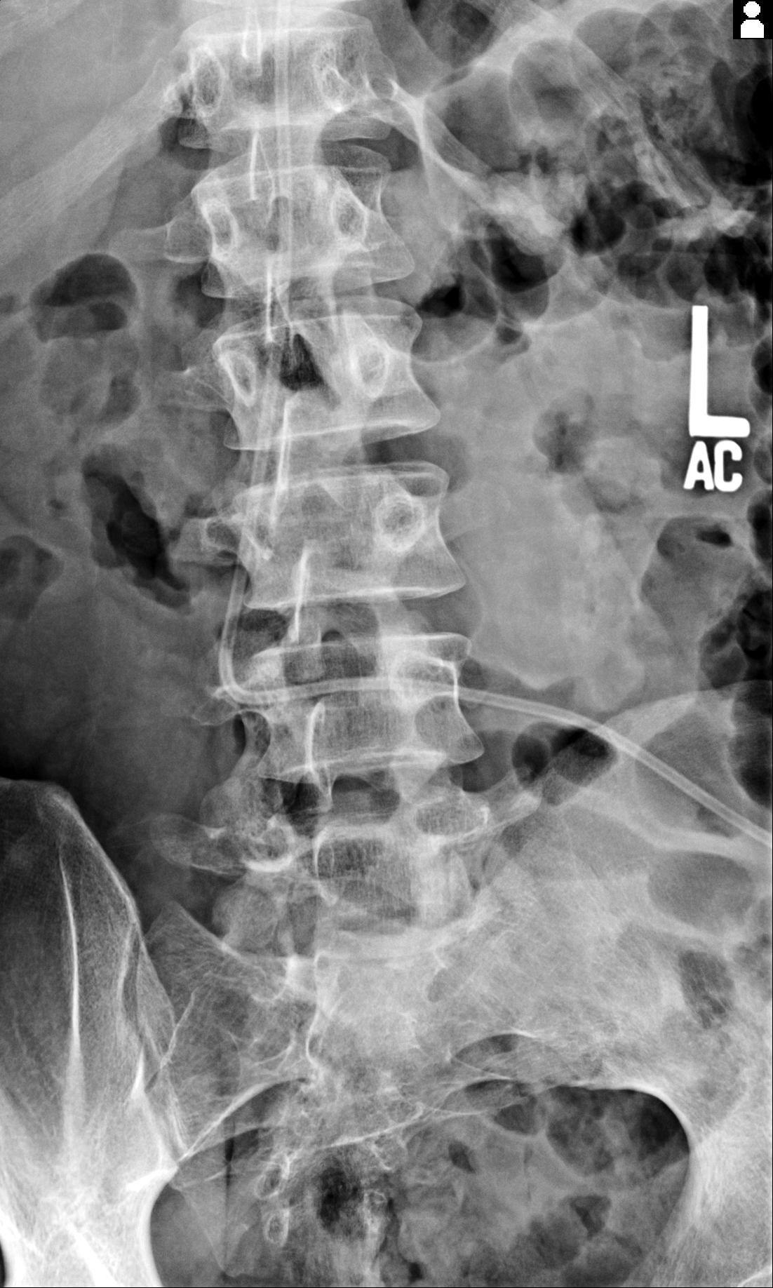
[im 5/5]
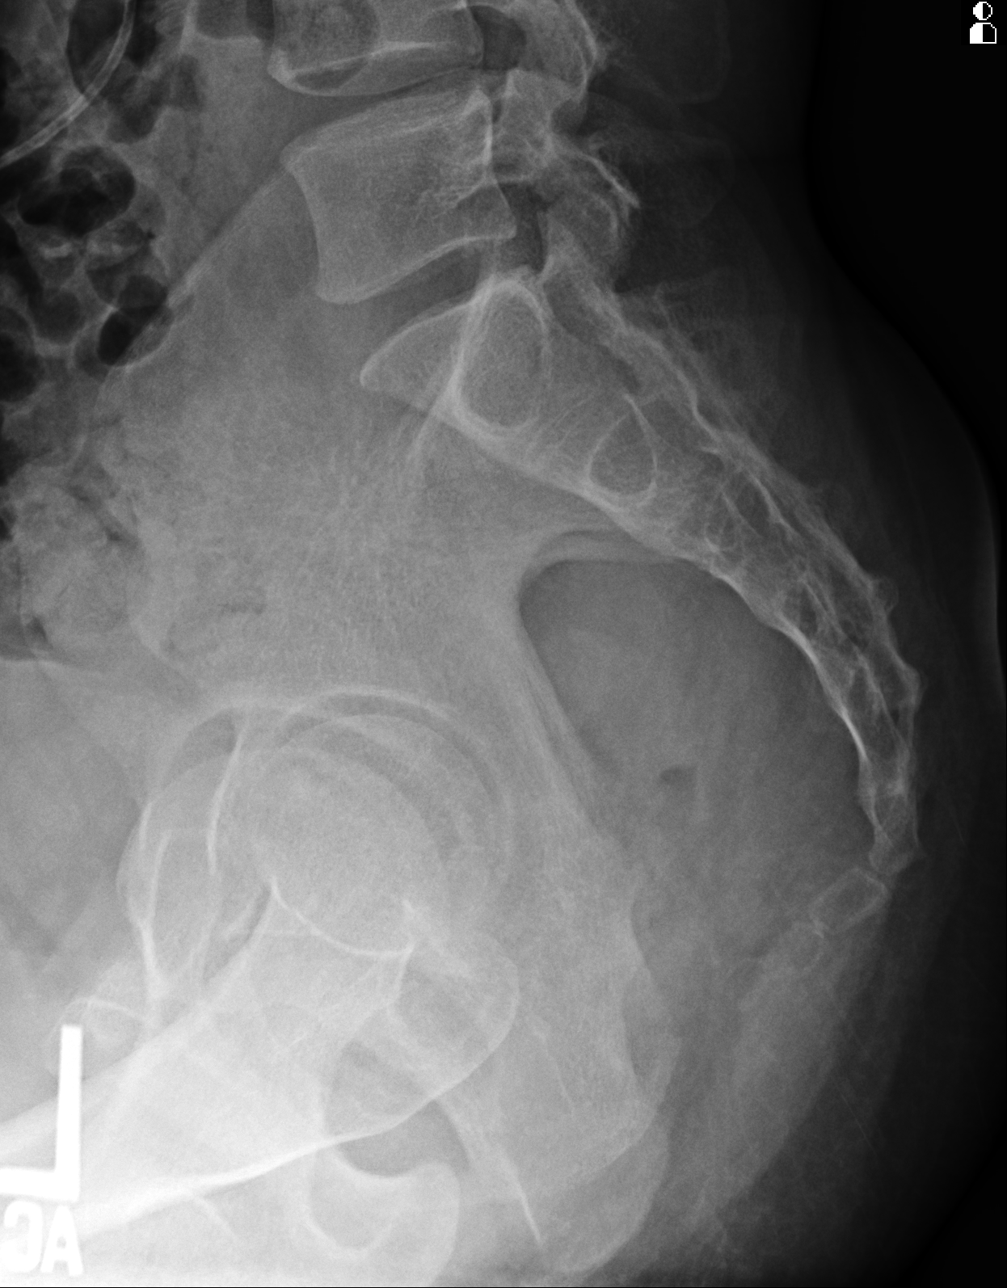

[5 of 5 positions shown; findings below may reference images not displayed]

FINDINGS: 5 view lumbar spine demonstrates mild thoracolumbar dextroscoliosis and lower lumbar levoscoliosis. No anterolisthesis or retrolisthesis. No fracture or destructive lesion. Disc spaces and facet joints appear adequately maintained. Paravertebral soft tissues unremarkable. SI joints are open. Ventriculoperitoneal shunt noted.
IMPRESSION: Scoliosis as discussed above. No fractures or destructive lesions. No anomalous segments.

## 2020-01-04 IMAGING — MR MRI LSPINE WO CONTRAST
5 of 6 series · 34 of 48 positions shown · non-contrast
Comparison: Lumbar spine radiographs 07/29/2019

HISTORY: Low back pain
TECHNIQUE: Routine multiplanar MRI of the lumbar spine was performed without IV contrast.

[Series 16: t2_sag · sagittal · 4.0mm · 0.73mm/px · 5 of 16 slices shown]
[im 1/16]
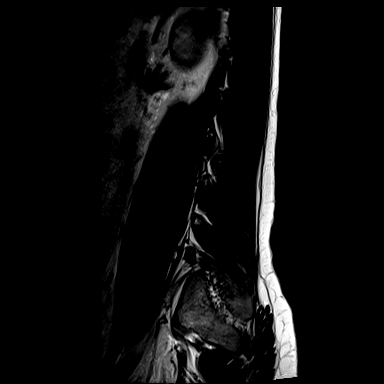
[im 4/16]
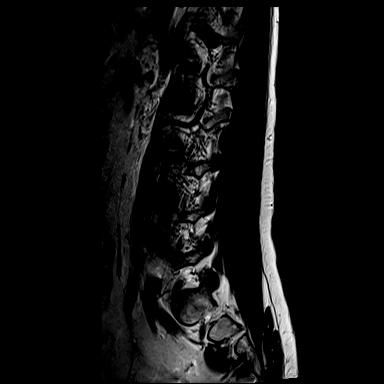
[im 8/16]
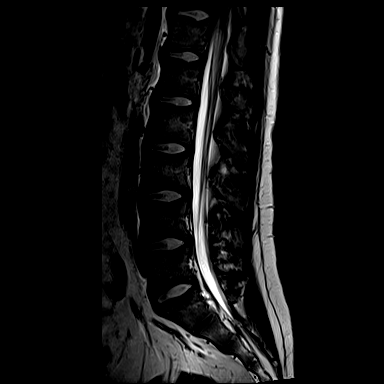
[im 12/16]
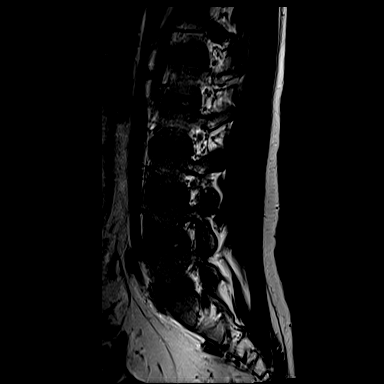
[im 16/16]
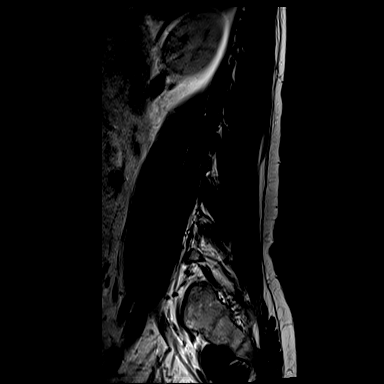

[Series 17: t1_sag · sagittal · 4.0mm · 0.88mm/px · 5 of 16 slices shown]
[im 1/16]
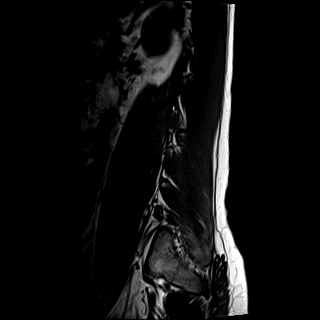
[im 4/16]
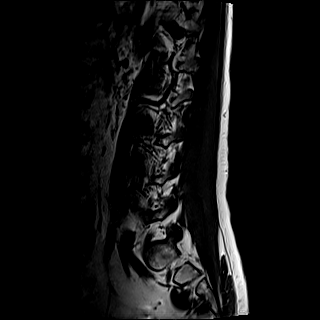
[im 8/16]
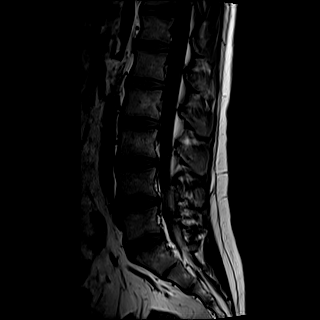
[im 12/16]
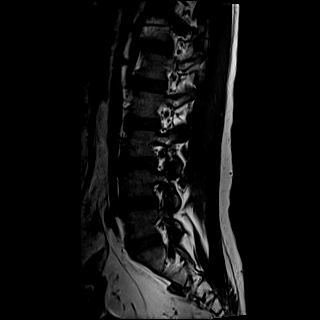
[im 16/16]
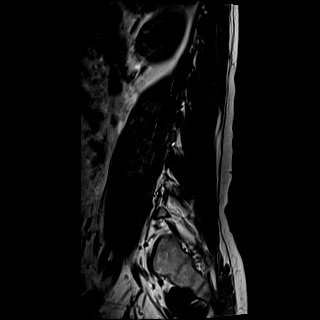

[Series 18: stir_sag · sagittal · 4.0mm · 1.09mm/px · 5 of 16 slices shown]
[im 1/16]
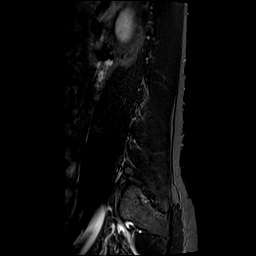
[im 4/16]
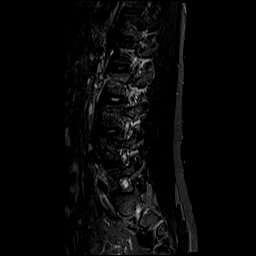
[im 8/16]
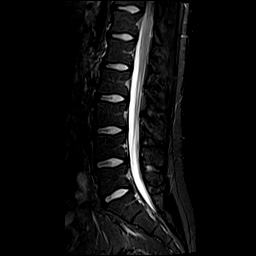
[im 12/16]
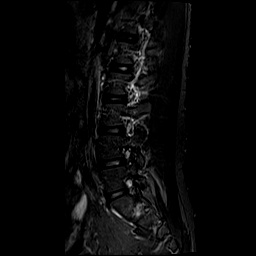
[im 16/16]
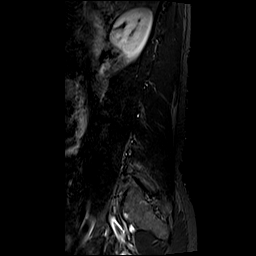

[Series 19: t2_axial · axial · 4.0mm · 0.62mm/px · z∈[-418,-233]mm · 11 of 38 slices shown]
[im 1/38]
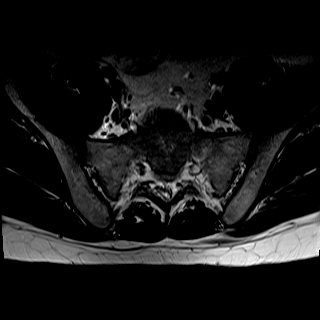
[im 4/38]
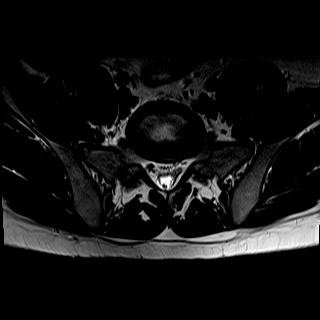
[im 8/38]
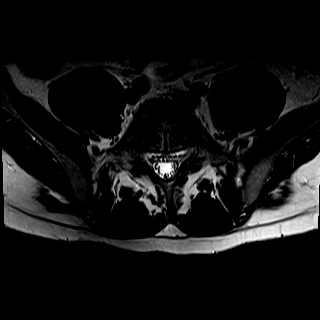
[im 12/38]
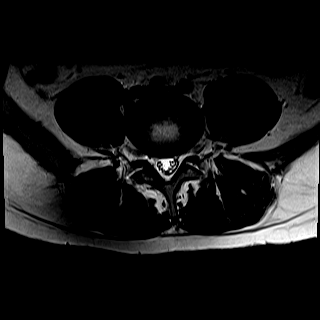
[im 15/38]
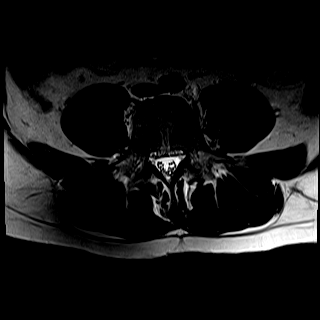
[im 19/38]
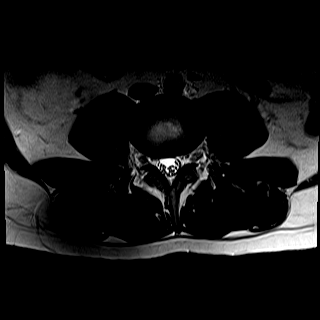
[im 23/38]
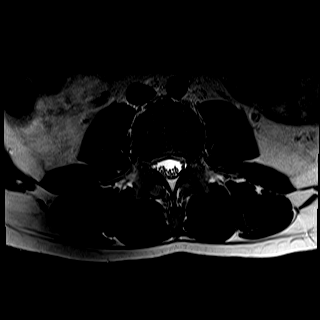
[im 26/38]
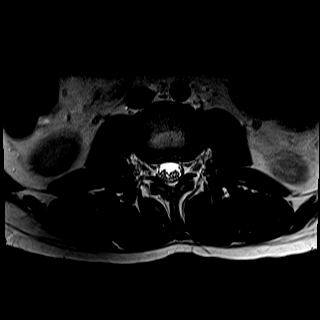
[im 30/38]
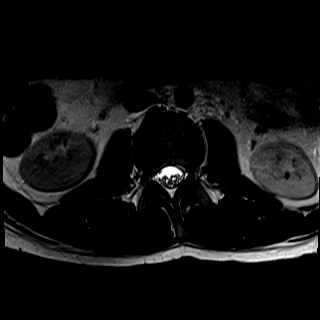
[im 34/38]
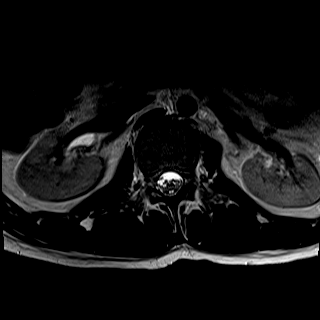
[im 38/38]
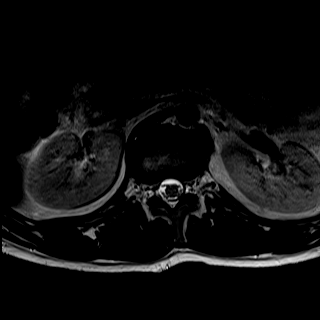

[Series 20: t1_axial_obli · axial · 3.0mm · 0.78mm/px · z∈[-442,-242]mm · 8 of 27 slices shown]
[im 1/27]
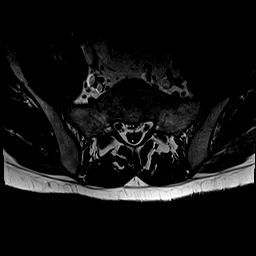
[im 4/27]
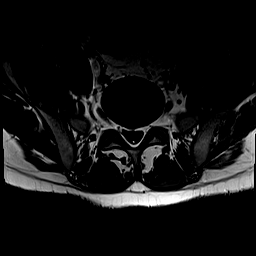
[im 8/27]
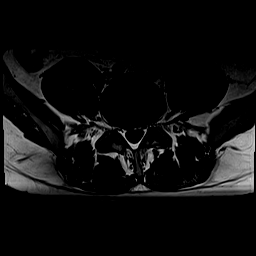
[im 12/27]
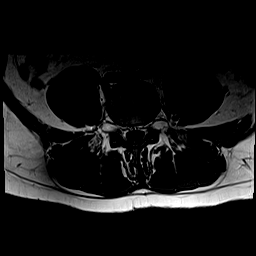
[im 15/27]
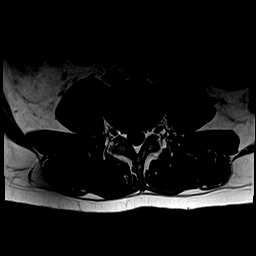
[im 19/27]
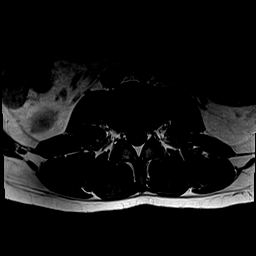
[im 23/27]
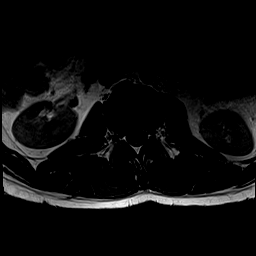
[im 27/27]
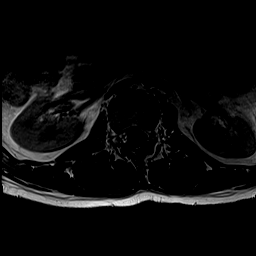

[34 of 48 positions shown; findings below may reference images not displayed]

FINDINGS: Lumbar segments maintain appropriate alignment. No fractures. No destructive lesions. Disc spaces appear adequately maintained in both height and signal intensity. No abnormal signal in an intradural or paraspinous position.

Localizer images suggest degenerative changes of the hips bilaterally. Right coxa valga and right acetabular dysplasia.

L1-2, L2-3, L3-4, L4-5, L5-S1: No significant disc bulge or herniation. There is no evidence of canal or foraminal stenosis. No significant facet arthropathy.
IMPRESSION: 1. Normal MRI lumbar spine as discussed above.

2. Right coxa valga and acetabular dysplasia. Associated osteoarthritis. Correlate with right hip MRI. There also appears to be degenerative changes of the left hip. Probable left hip coxa valga. Correlate with left hip MRI as well.

## 2020-01-04 IMAGING — CR HIP RT 2 VW
1 series · 2 of 2 positions shown · non-contrast
Comparison: None.

HISTORY: Hip pain.
TECHNIQUE: Right hip x-rays 2 views.

[Series 2: ap · 0.17mm/px · 2 of 2 slices shown]
[im 1/2]
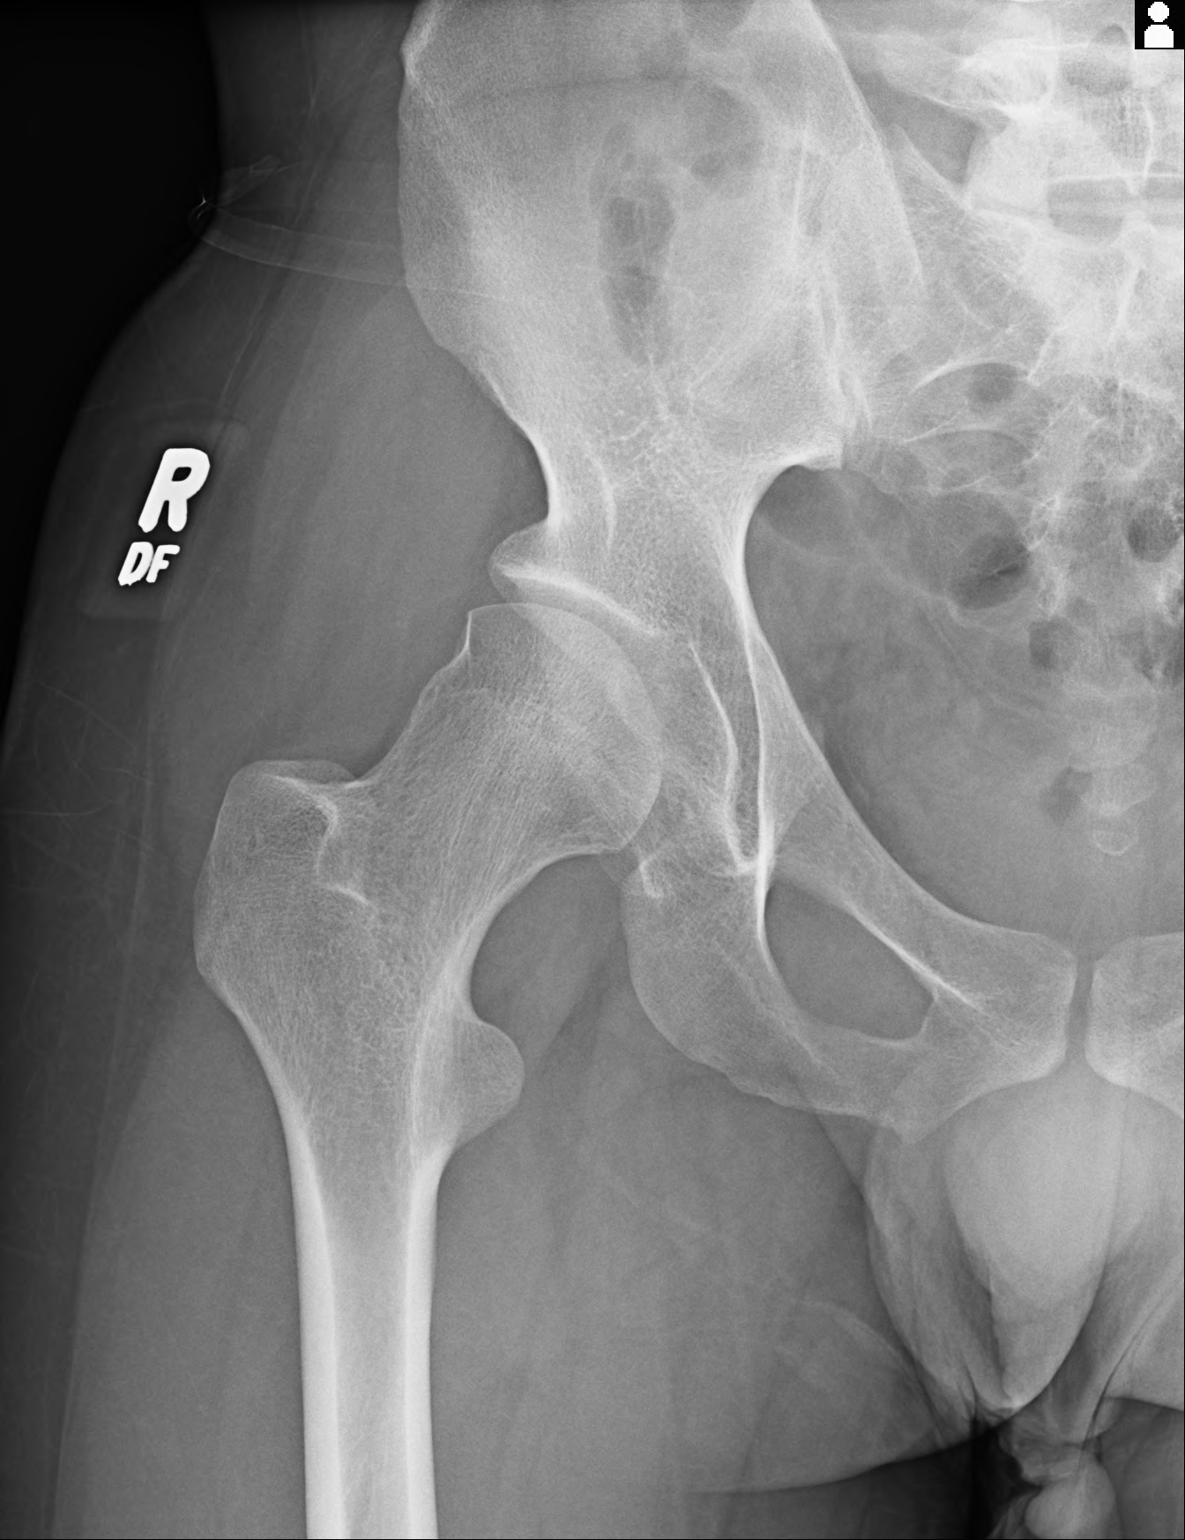
[im 2/2]
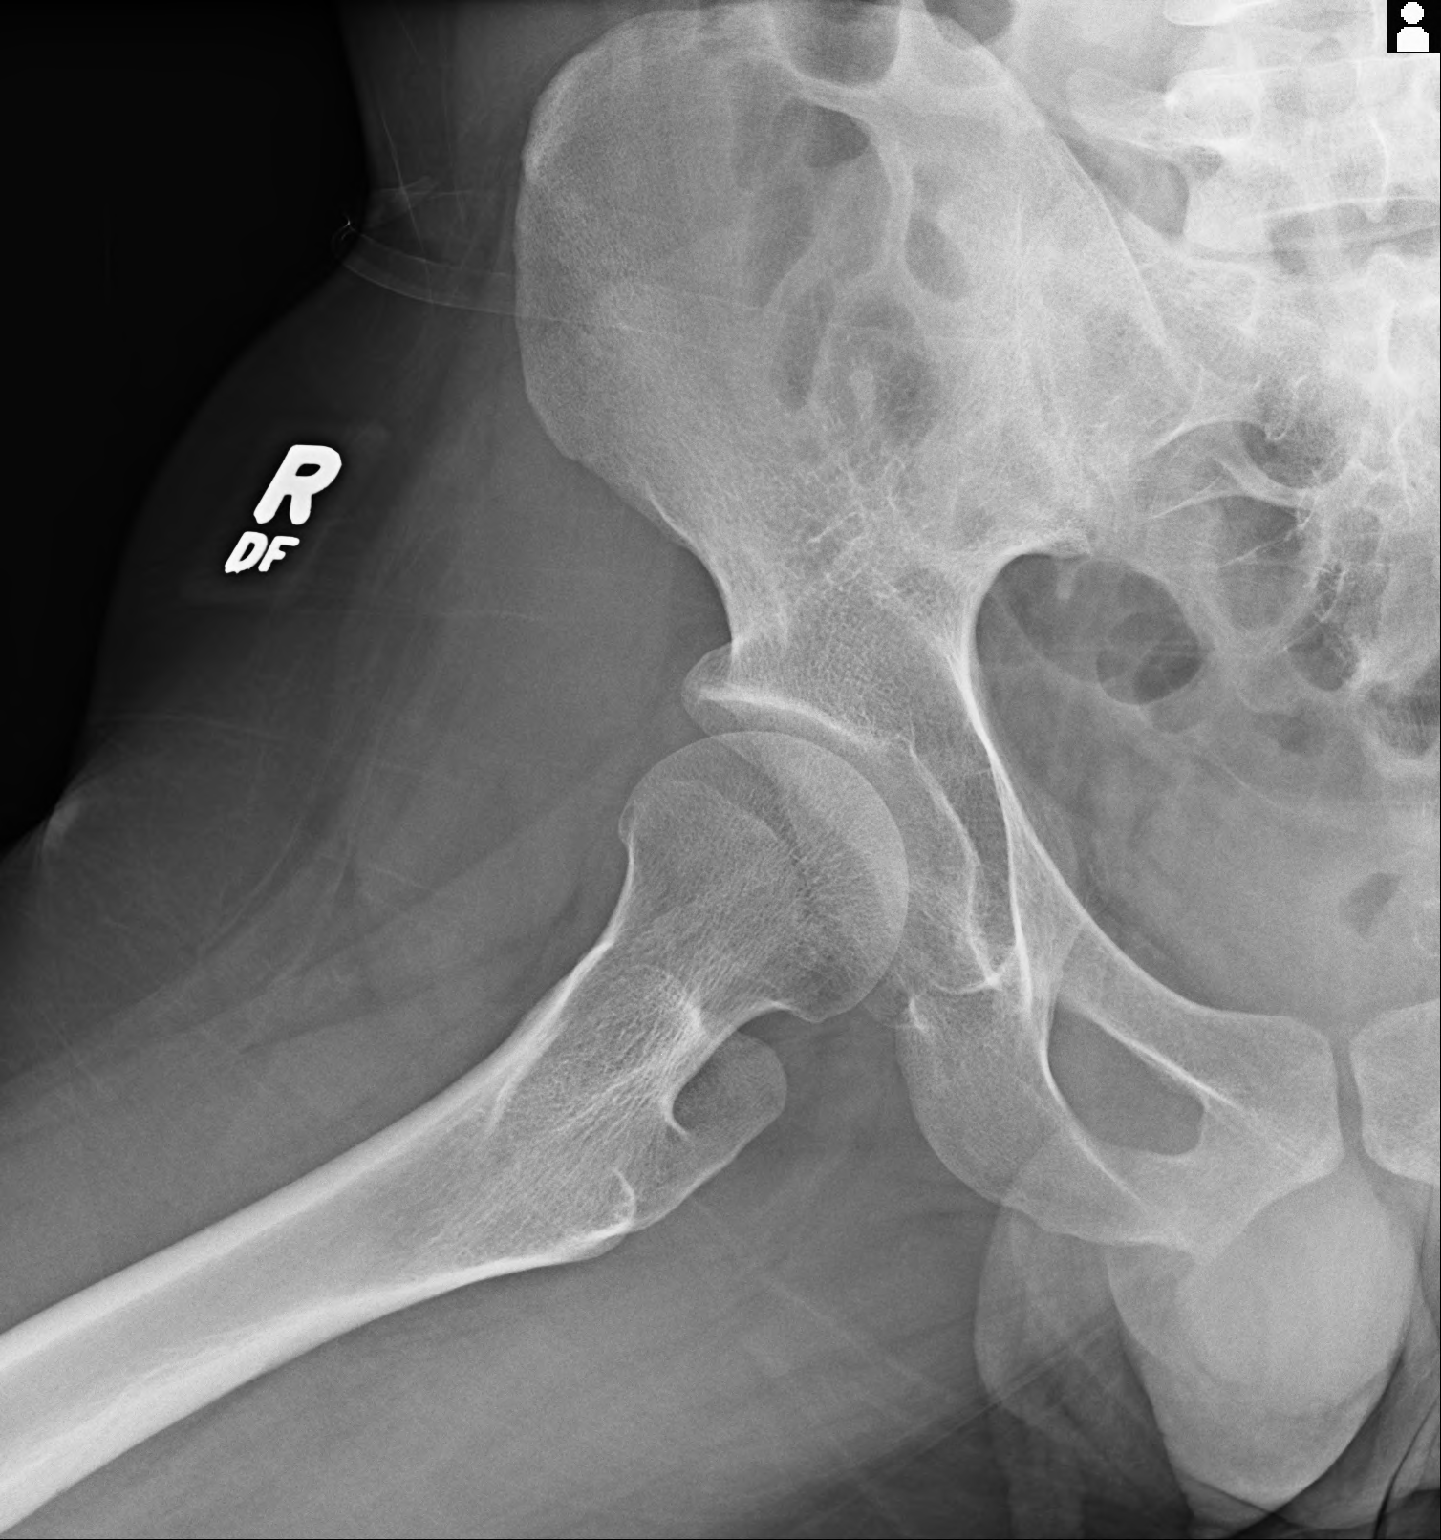

[2 of 2 positions shown; findings below may reference images not displayed]

FINDINGS: There is deformity of the right femoral head and neck. Also, somewhat elongated acetabular superior roof. Impingement is a consideration as well as congenital abnormality. Consider follow-up MRI of hip.
IMPRESSION: 1. Good mineralization, no fracture.

2. Deformity of acetabulum and femoral head and neck. Likely congenital. Could be posttraumatic, could be impingement.

## 2020-01-04 IMAGING — CR [HOSPITAL] SKULL
1 series · 2 of 2 positions shown · non-contrast
Comparison: None

HISTORY: MRI Clearance
TECHNIQUE: Skull 2 view No Charge

[Series 1: pa · 0.17mm/px · 2 of 2 slices shown]
[im 1/2]
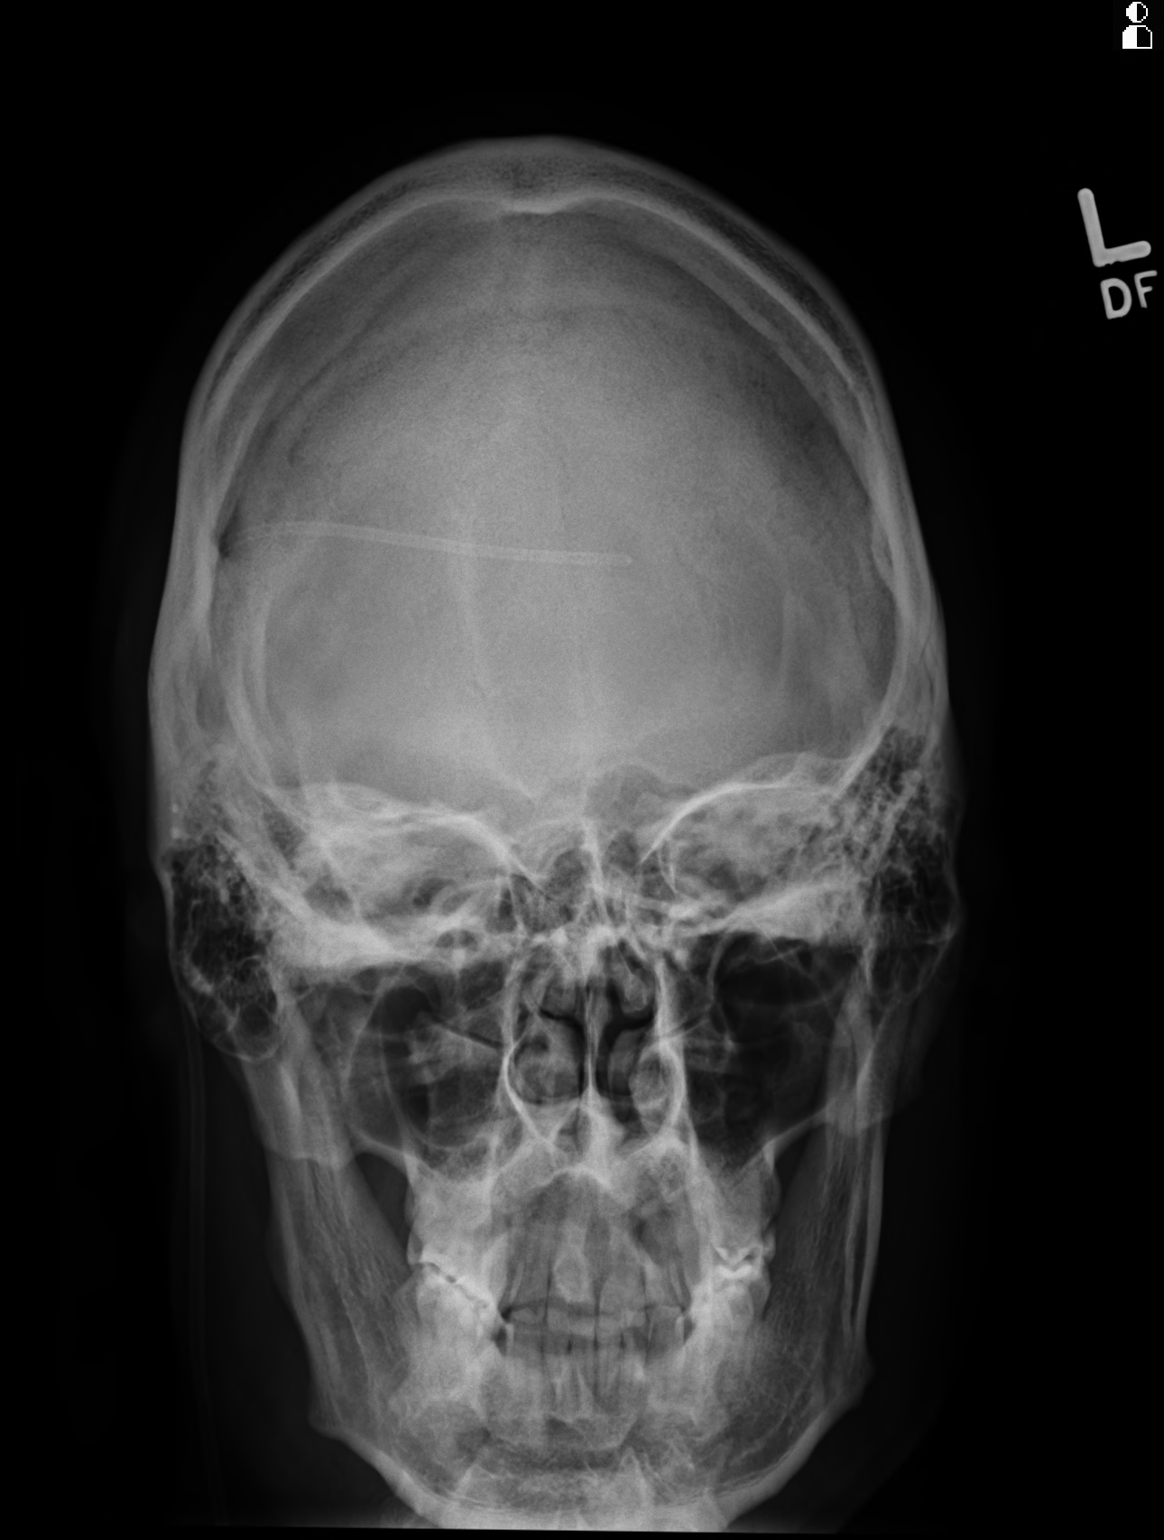
[im 2/2]
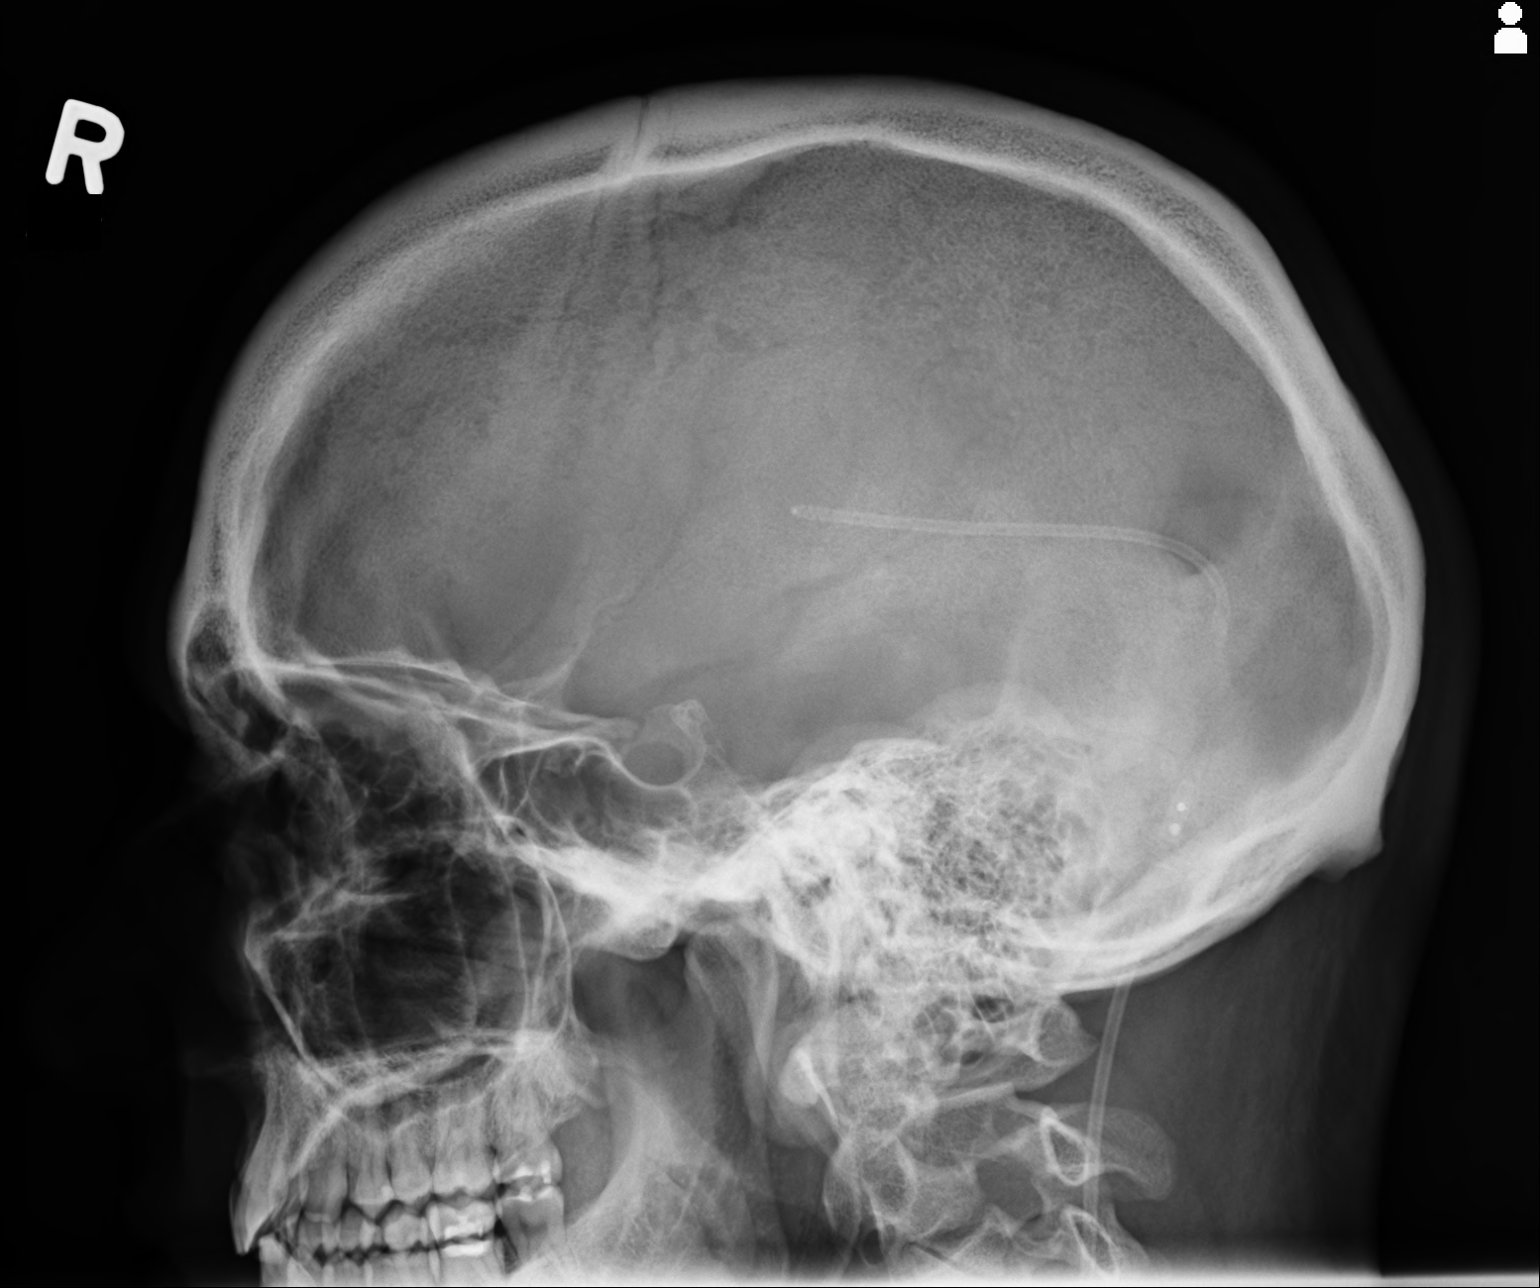

[2 of 2 positions shown; findings below may reference images not displayed]

FINDINGS: 2 view skull demonstrates ventricular shunt catheter entering via a right occipital approach with tip probably within the left lateral ventricle. Extracalvarial connector with a few tiny metallic densities.
IMPRESSION: Patient may be safely placed into the MRI unit.

## 2020-03-18 IMAGING — MR MRI HIP RT WO CONTRAST
5 series · 37 of 40 positions shown · non-contrast
Comparison: Right hip radiographs 01/04/2020

INDICATION: Right hip pain.
TECHNIQUE: Multiplanar, multiecho MR imaging of the right hip was performed, including T1-weighted and fluid-sensitive sequences without intravenous contrast.

[Series 2: t1_cor · coronal · right · 4.0mm · 0.66mm/px · 7 of 32 slices shown]
[im 1/32]
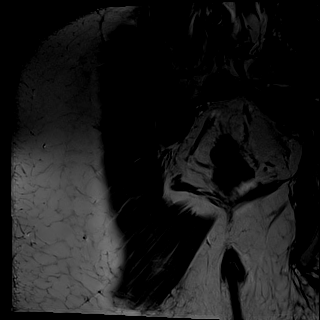
[im 6/32]
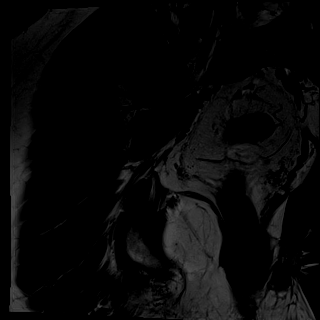
[im 11/32]
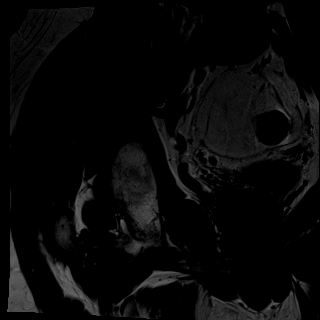
[im 16/32]
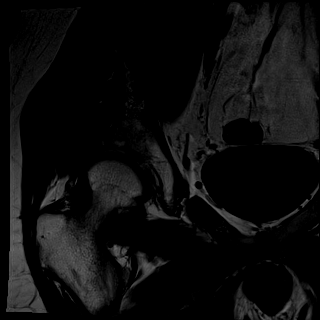
[im 21/32]
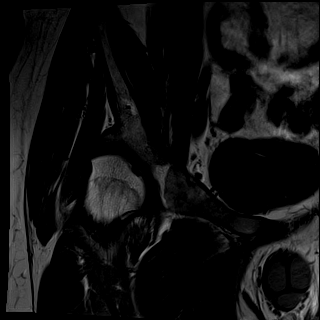
[im 26/32]
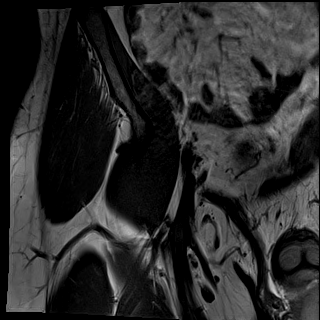
[im 32/32]
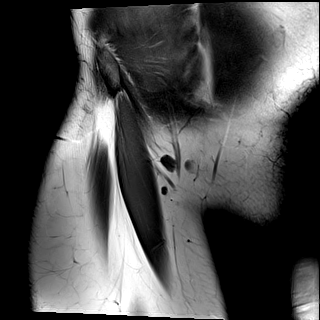

[Series 3: t2_cor_fs · coronal · right · 4.0mm · 0.82mm/px · 7 of 32 slices shown]
[im 1/32]
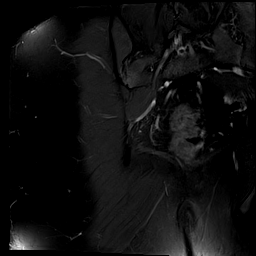
[im 6/32]
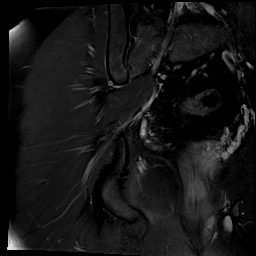
[im 11/32]
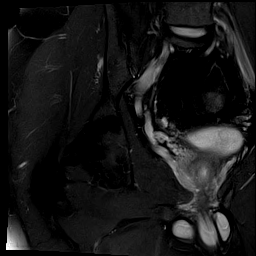
[im 16/32]
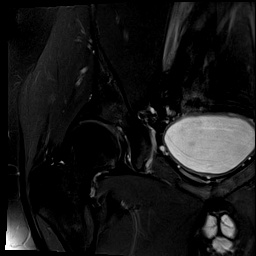
[im 21/32]
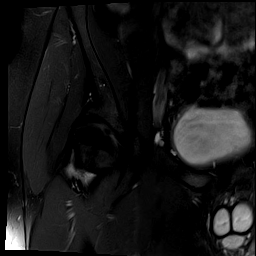
[im 26/32]
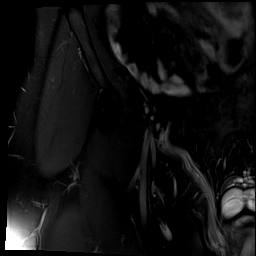
[im 32/32]
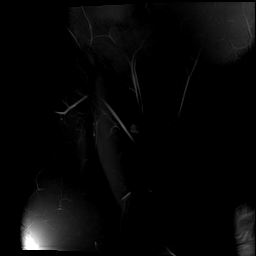

[Series 4: t2_axial_fs · axial · right · 4.0mm · 0.62mm/px · z∈[-82,+113]mm · 9 of 40 slices shown]
[im 1/40]
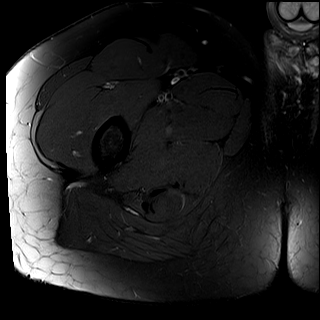
[im 5/40]
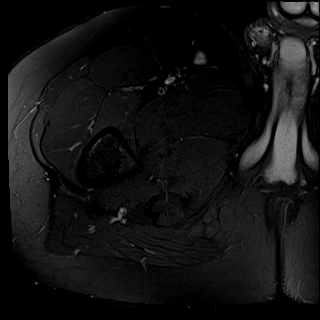
[im 9/40]
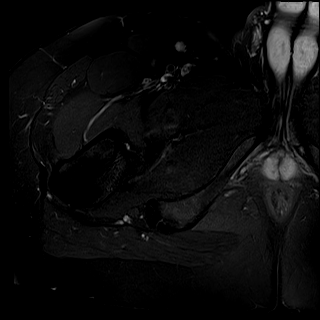
[im 14/40]
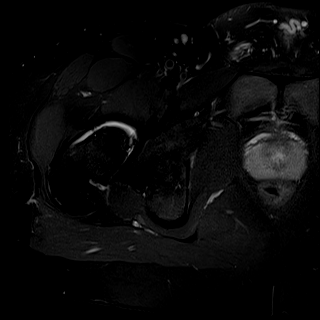
[im 18/40]
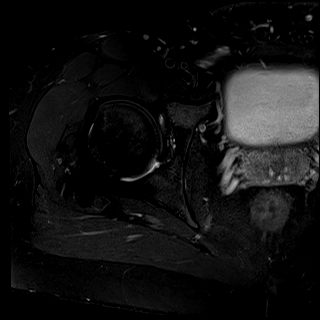
[im 22/40]
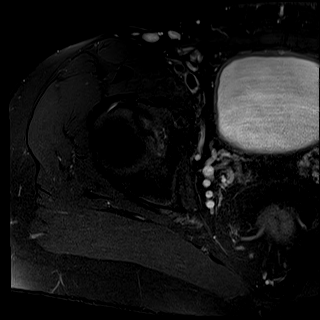
[im 27/40]
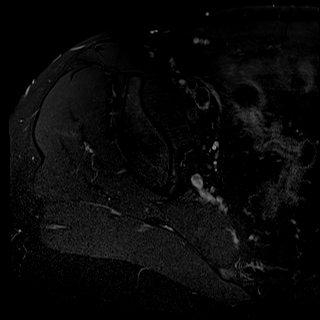
[im 35/40]
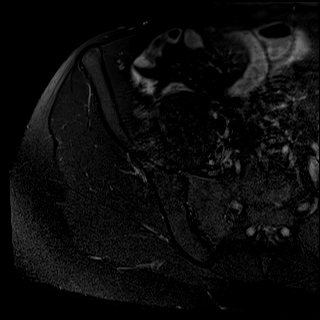
[im 40/40]
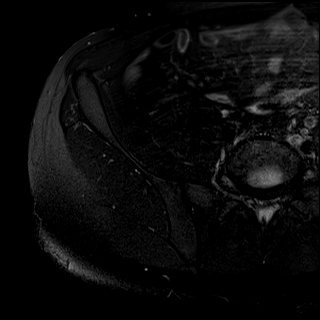

[Series 5: t1_axial · axial · right · 4.0mm · 0.39mm/px · z∈[-82,+113]mm · 8 of 40 slices shown]
[im 1/40]
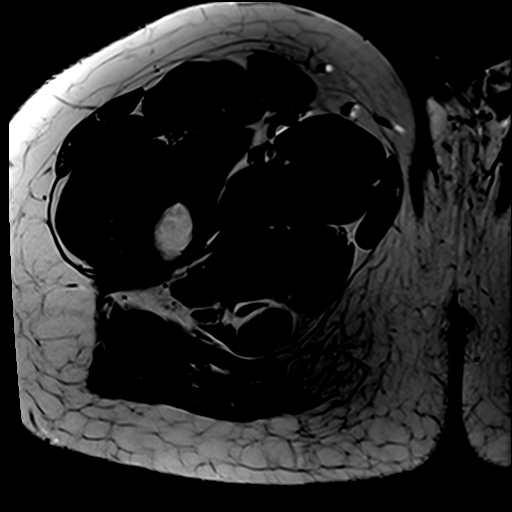
[im 5/40]
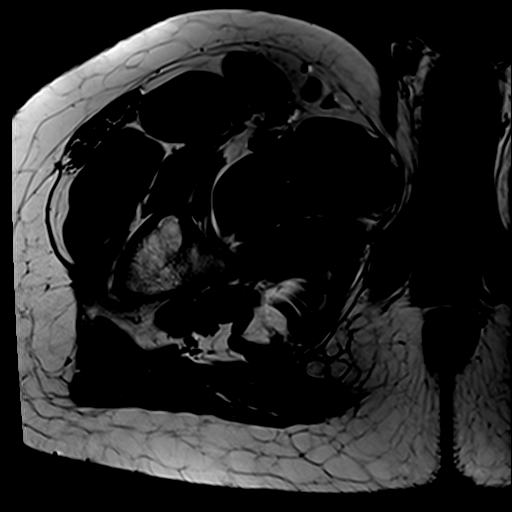
[im 14/40]
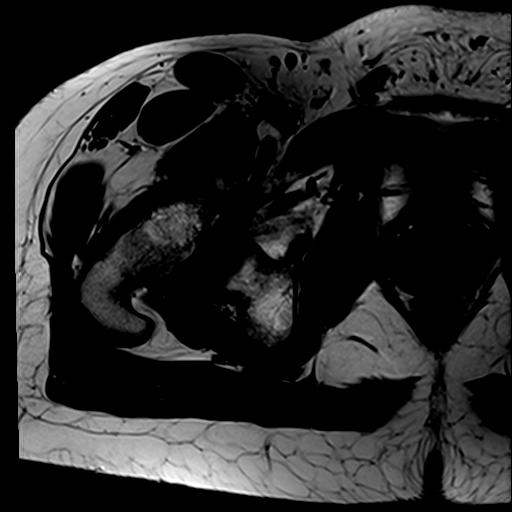
[im 18/40]
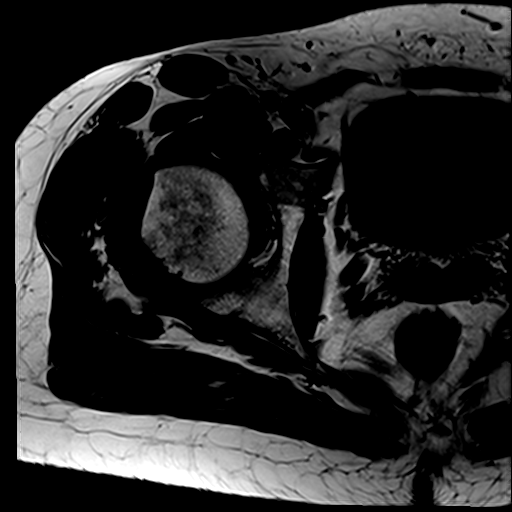
[im 22/40]
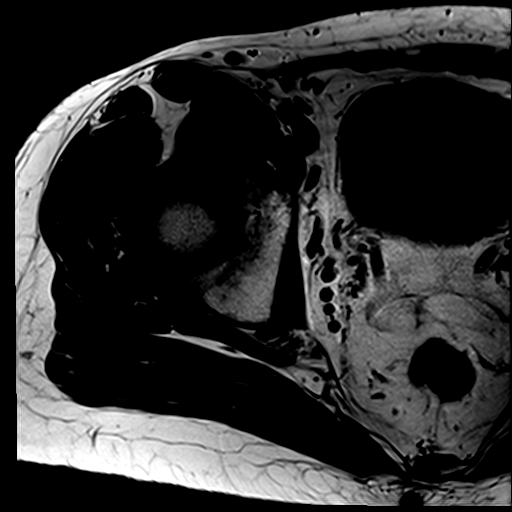
[im 27/40]
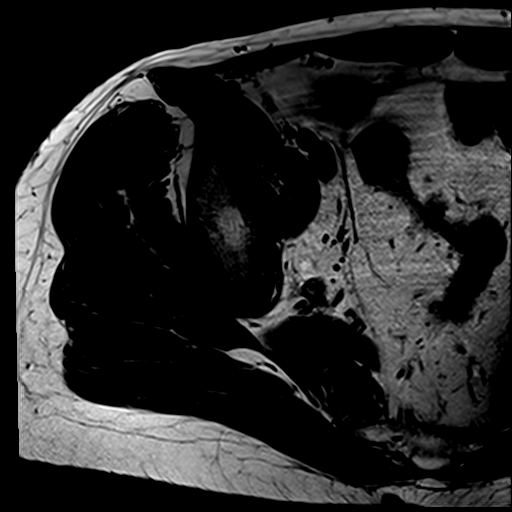
[im 35/40]
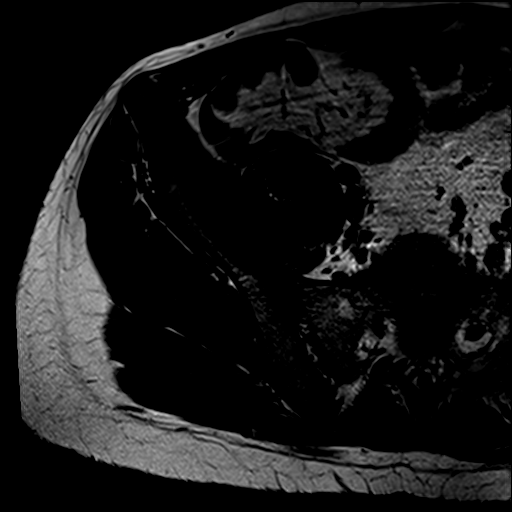
[im 40/40]
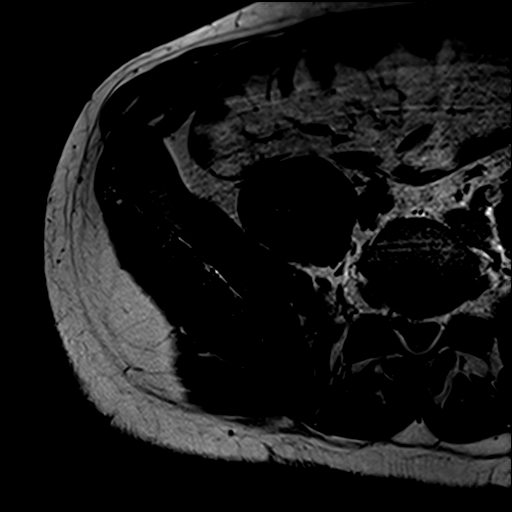

[Series 6: t2_sag_fs · sagittal · right · 4.0mm · 0.62mm/px · 6 of 24 slices shown]
[im 1/24]
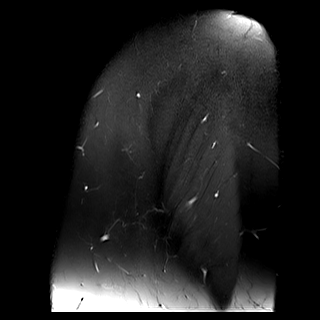
[im 5/24]
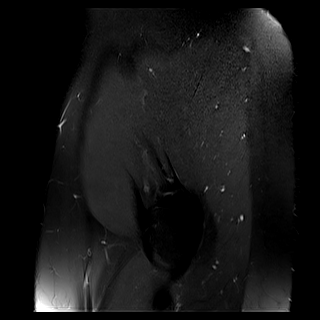
[im 10/24]
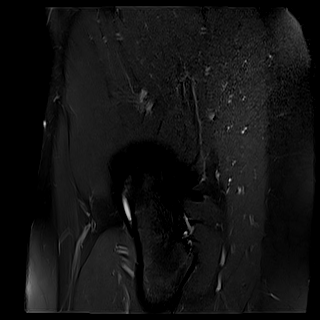
[im 14/24]
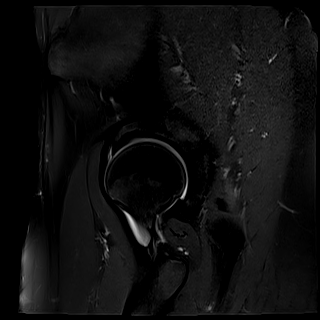
[im 19/24]
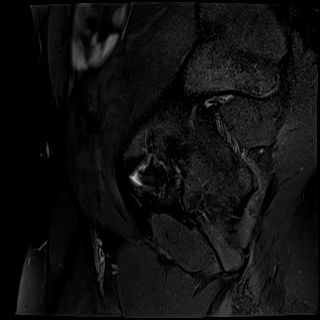
[im 24/24]
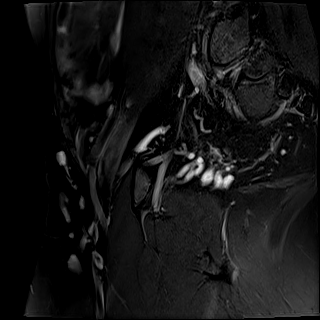

[37 of 40 positions shown; findings below may reference images not displayed]

FINDINGS: Marrow: No acute fracture. No femoral head AVN. Coxa valga with chronic erosion-type deformity at the lateral femoral head.

Osseous morphology: Coxa valga. Undercoverage of the femoral head by the acetabulum with a center edge angle of 21 degrees. Normal acetabular version.

Visualized SI joints and pubic symphysis: Pubic symphysis is intact. Minimal right SI joint DJD.

Hip joints: No joint effusion. No chondral defects. No labral tear. Ligamentum teres is intact.

Tendons: Gluteal tendons are intact. No trochanteric bursitis. Iliopsoas and rectus femoris tendons are intact. Hamstring tendon origin is intact.

Soft tissues: No acute muscle injury. No muscle atrophy. No right inguinal adenopathy or hernia.

No free fluid in the pelvis.
IMPRESSION: 1. Intact cartilage and labrum of the right hip.

2. Coxa valga deformity of the right hip with acetabular dysplasia and chronic pressure erosion-type deformity at the lateral aspect of the femoral head.

3. Intact muscles and tendons about the right hip.

## 2020-03-18 IMAGING — MR MRI HIP LT WO CONTRAST
4 of 5 series · 33 of 40 positions shown · non-contrast
Comparison: None.

INDICATION: Left hip pain,
TECHNIQUE: Multiplanar, multiecho MR imaging of the left hip was performed, including T1-weighted and fluid-sensitive sequences without intravenous contrast.

[Series 2: t1_cor · coronal · left · 4.0mm · 0.55mm/px · 8 of 32 slices shown]
[im 1/32]
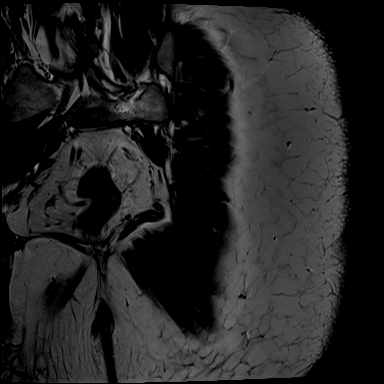
[im 5/32]
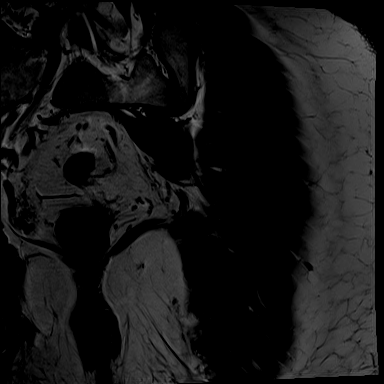
[im 9/32]
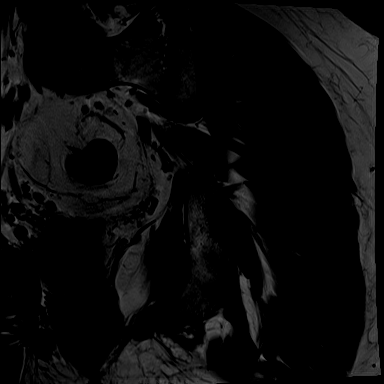
[im 14/32]
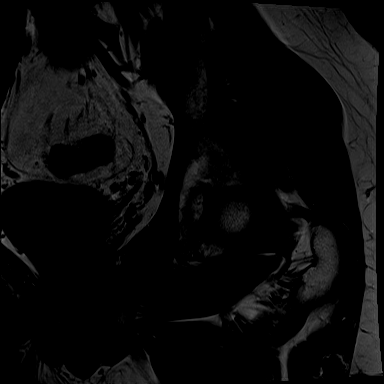
[im 18/32]
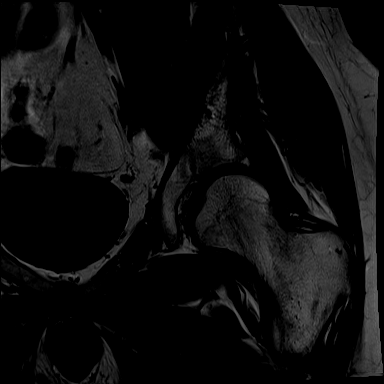
[im 23/32]
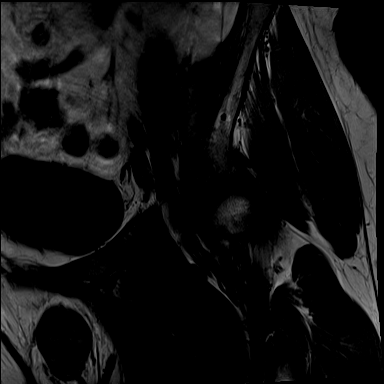
[im 27/32]
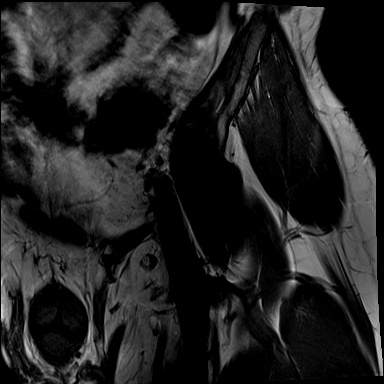
[im 32/32]
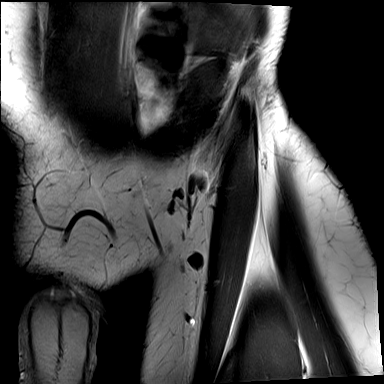

[Series 3: t2_cor_fs · coronal · left · 4.0mm · 0.82mm/px · 8 of 32 slices shown]
[im 1/32]
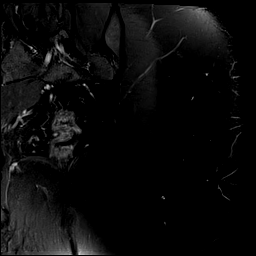
[im 5/32]
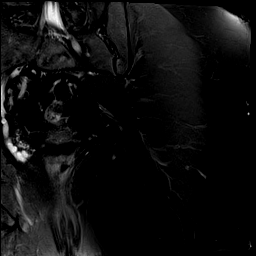
[im 9/32]
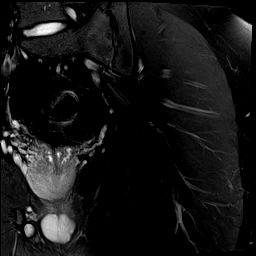
[im 14/32]
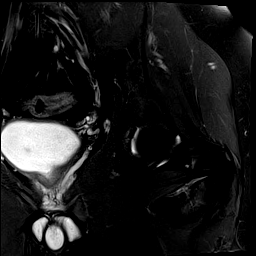
[im 18/32]
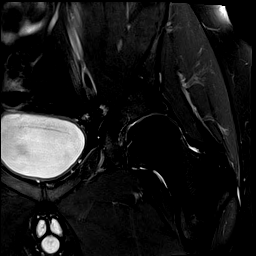
[im 23/32]
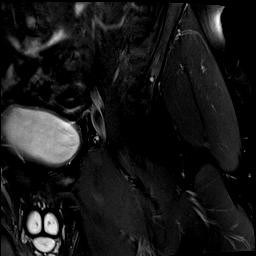
[im 27/32]
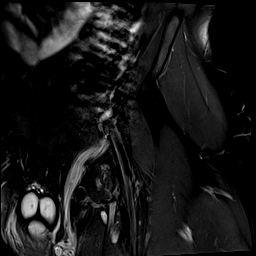
[im 32/32]
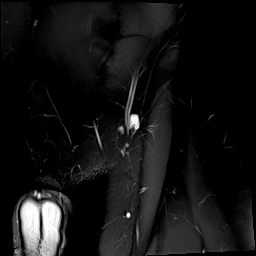

[Series 4: t2_axial_fs · axial · left · 4.0mm · 0.62mm/px · z∈[-85,+110]mm · 9 of 40 slices shown]
[im 1/40]
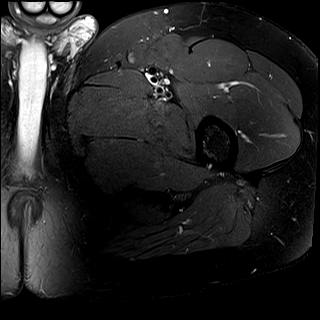
[im 5/40]
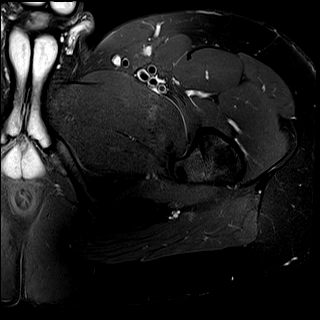
[im 10/40]
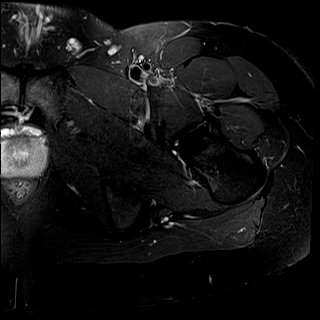
[im 15/40]
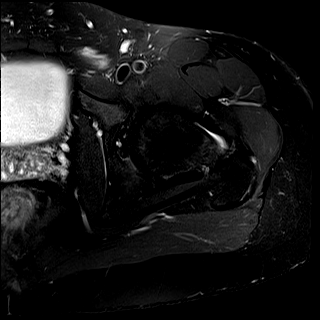
[im 20/40]
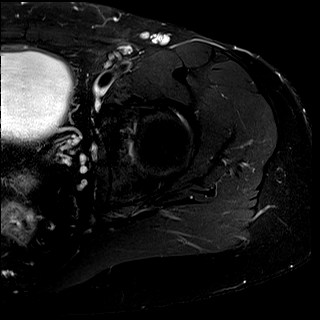
[im 25/40]
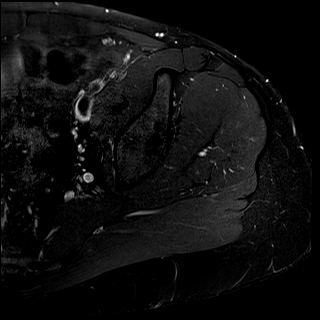
[im 30/40]
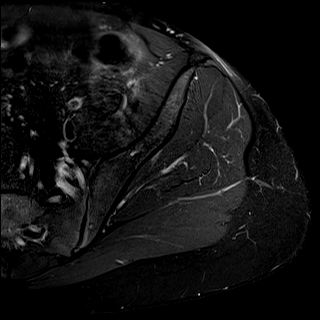
[im 35/40]
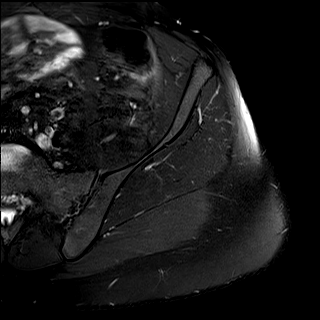
[im 40/40]
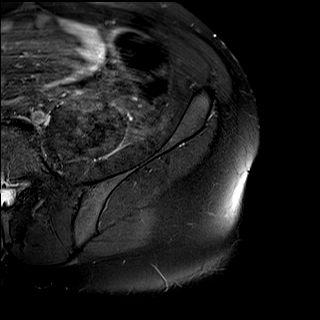

[Series 5: t1_axial · axial · left · 4.0mm · 0.39mm/px · z∈[-85,+85]mm · 8 of 40 slices shown]
[im 1/40]
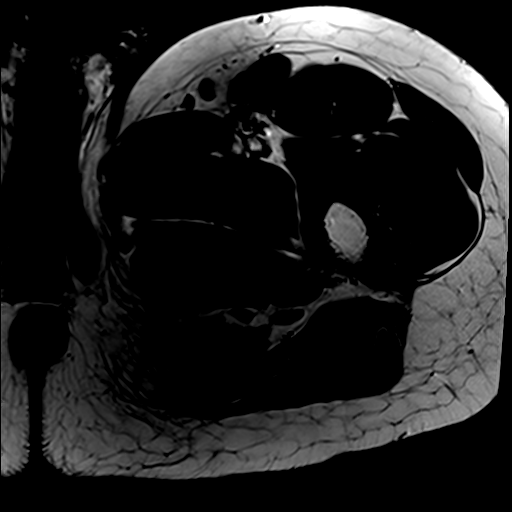
[im 5/40]
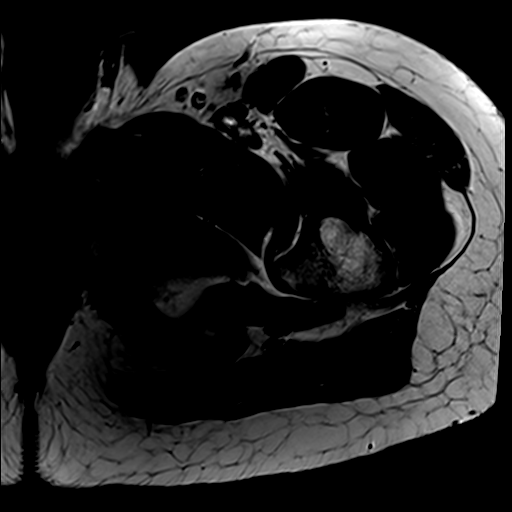
[im 10/40]
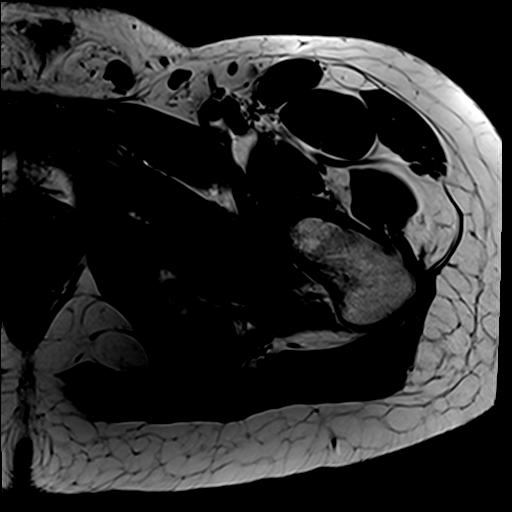
[im 15/40]
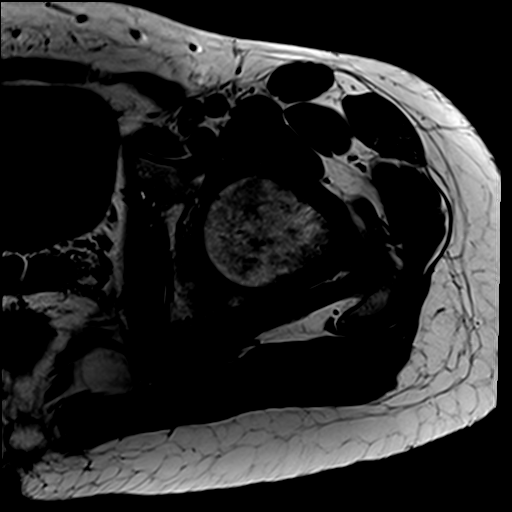
[im 20/40]
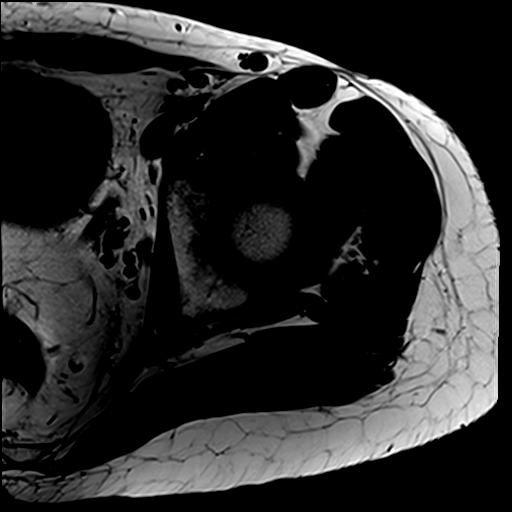
[im 25/40]
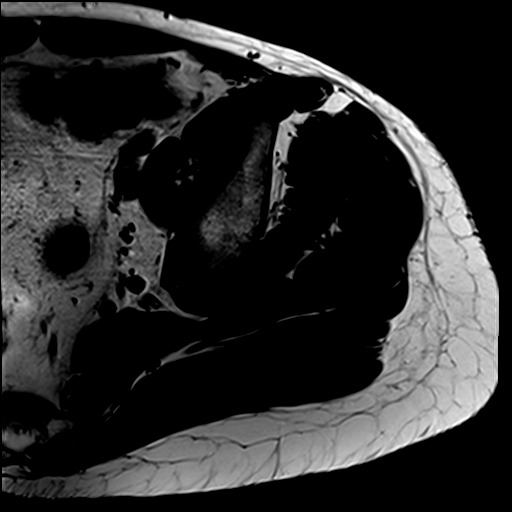
[im 30/40]
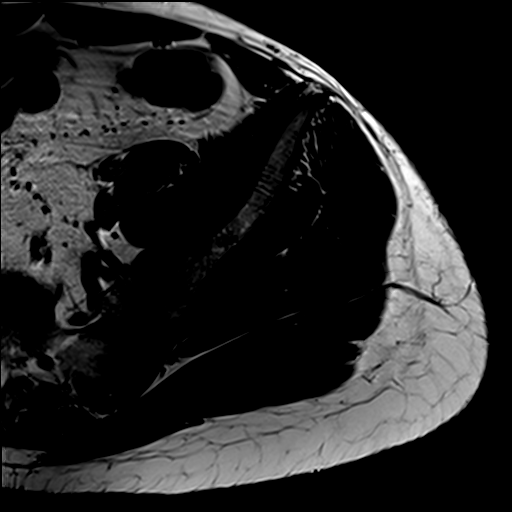
[im 35/40]
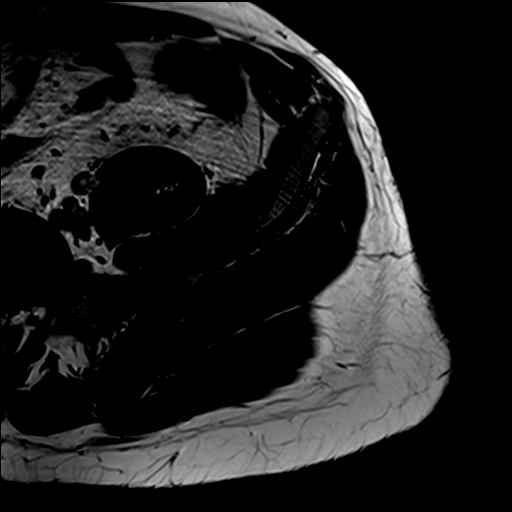

[33 of 40 positions shown; findings below may reference images not displayed]

FINDINGS: Marrow: No acute fracture. No femoral head AVN. No suspicious lytic or blastic osseous lesion.

Visualized SI joints and pubic symphysis: Mild left SI joint DJD. Pubic symphysis is intact.

Hip joints: No joint effusion. No chondral defects. No labral tear.

Osseous morphology: Mild cam morphology of the proximal left femur with decreased offset of the lateral femoral head and neck junction. Mild undercoverage of the femoral head by the acetabulum with a center edge angle of 27 degrees. Normal acetabular version.

Tendons: Gluteal tendons are intact. No trochanteric bursitis. The iliopsoas and rectus femoris tendons are intact. Hamstring tendon origin is intact.

Soft tissues: No acute muscle injury. No localized muscle atrophy. No left inguinal adenopathy or hernia.

No free fluid in the pelvis.
IMPRESSION: 1. Intact left hip labrum and cartilage.

2. Mild cam morphology of the proximal left femur. Mild left acetabular dysplasia, with undercoverage of the femoral head.

3. Intact muscles and tendons about the left hip.

4. Mild left SI joint DJD.

## 2021-02-23 IMAGING — CT CT ABD/PEL W CONT
2 of 4 series · 13 of 46 positions shown, 15 images · IV contrast (isovue)
Comparison: None

HISTORY: Umbilical pain
TECHNIQUE: Axial CT images were obtained through the abdomen and pelvis after oral and IV contrast, 100 mL of Isovue-XHH. Coronal and sagittal reconstructions were obtained.

[Series 4: soft tissue · axial · 0.50mm/px · z∈[-1562,-1167]mm · 10 of 95 slices shown, 12 images]
[im 8/95  soft-tissue]
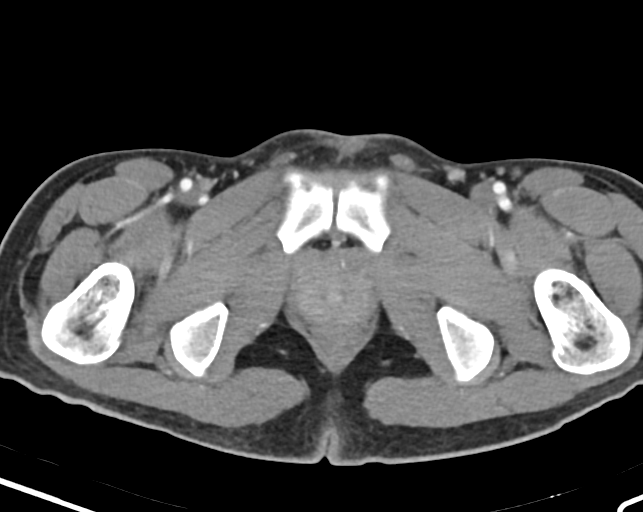
[im 8/95  bone]
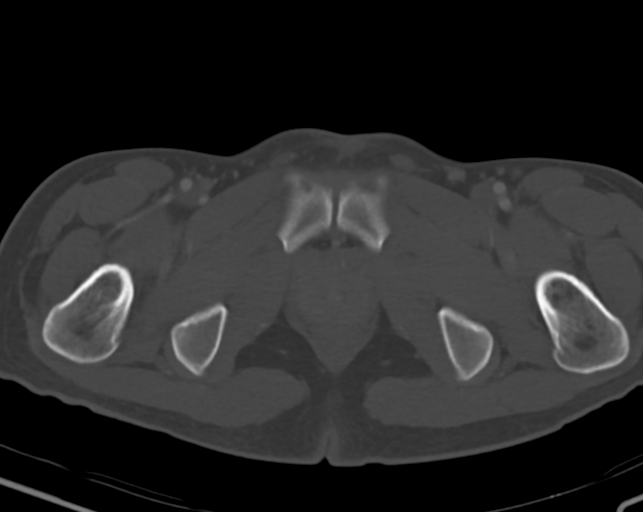
[im 16/95  soft-tissue]
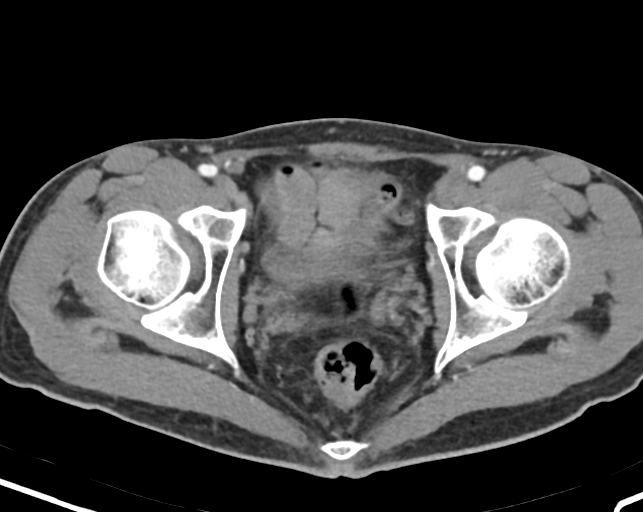
[im 27/95  soft-tissue]
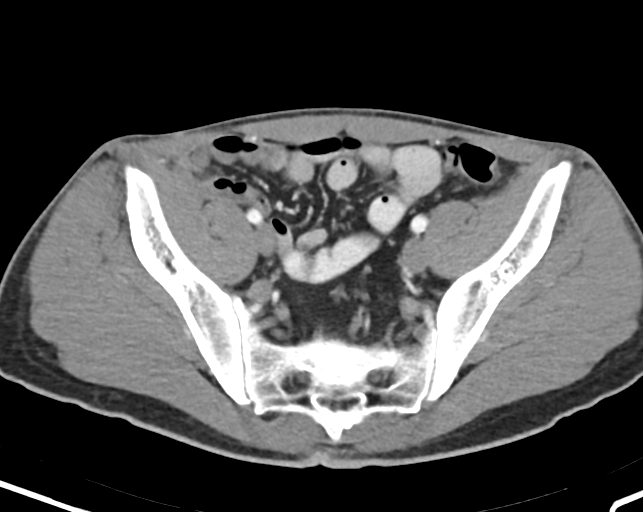
[im 34/95  soft-tissue]
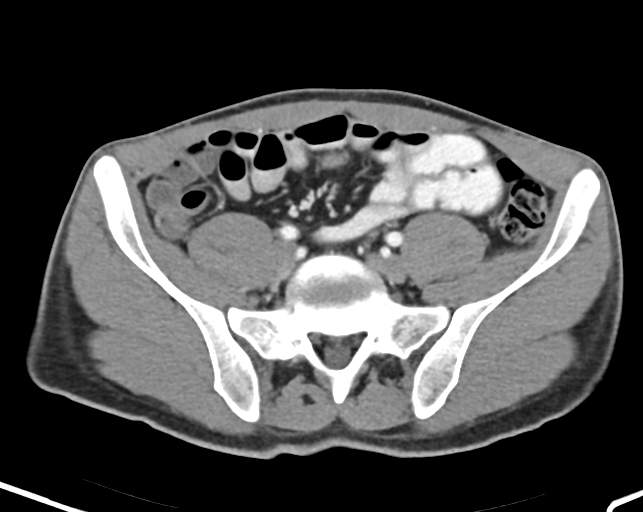
[im 42/95  soft-tissue]
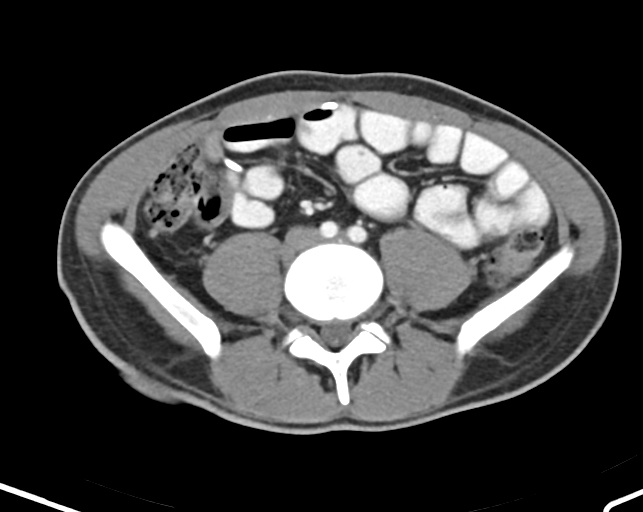
[im 53/95  soft-tissue]
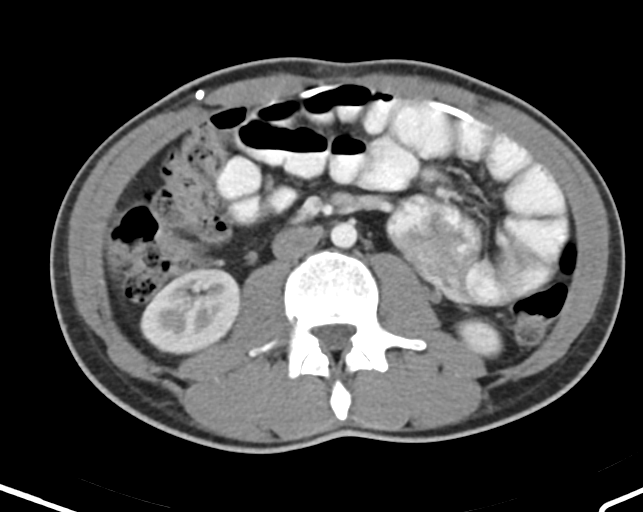
[im 61/95  soft-tissue]
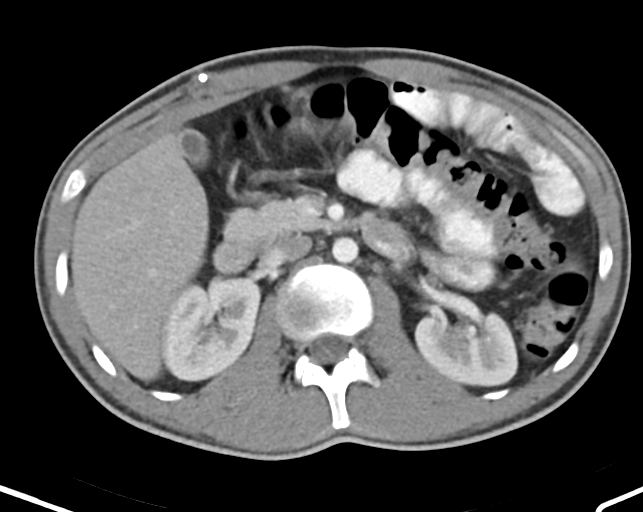
[im 72/95  soft-tissue]
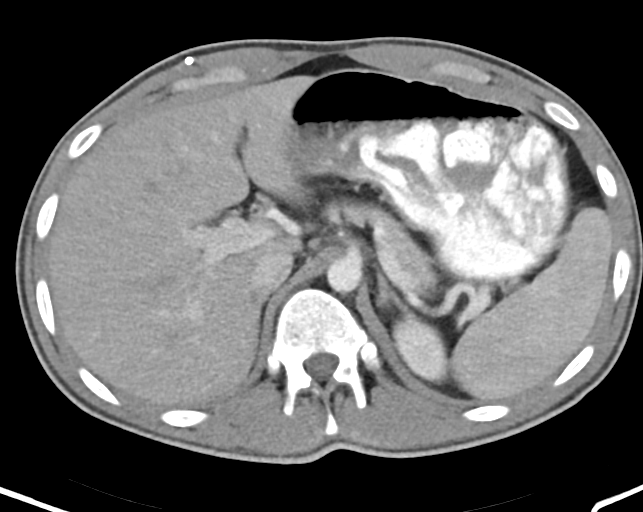
[im 79/95  soft-tissue]
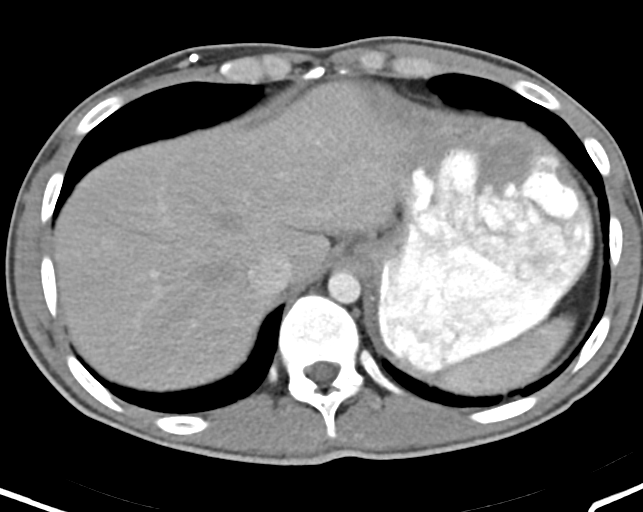
[im 79/95  bone]
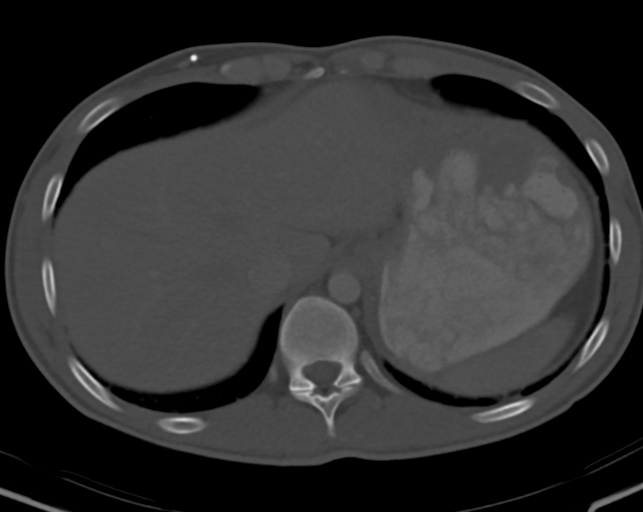
[im 87/95  soft-tissue]
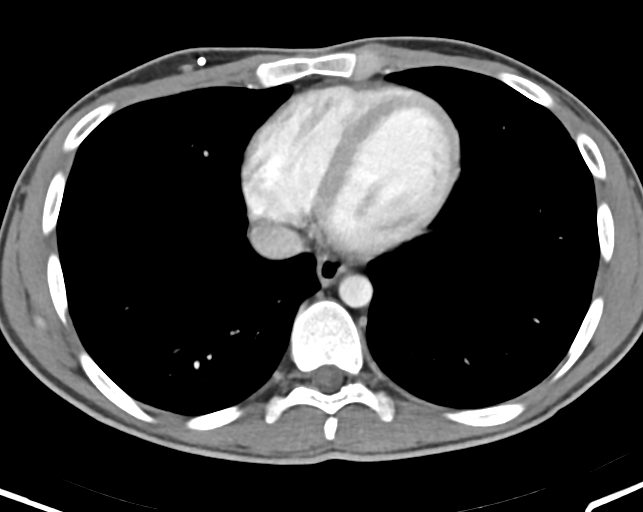

[Series 6: coronal · coronal · 0.62mm/px · 3 of 38 slices shown]
[im 13/38  soft-tissue]
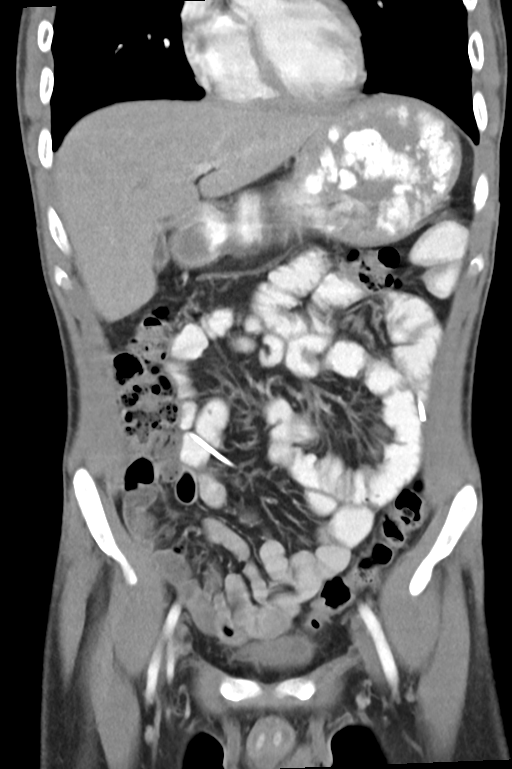
[im 17/38  soft-tissue]
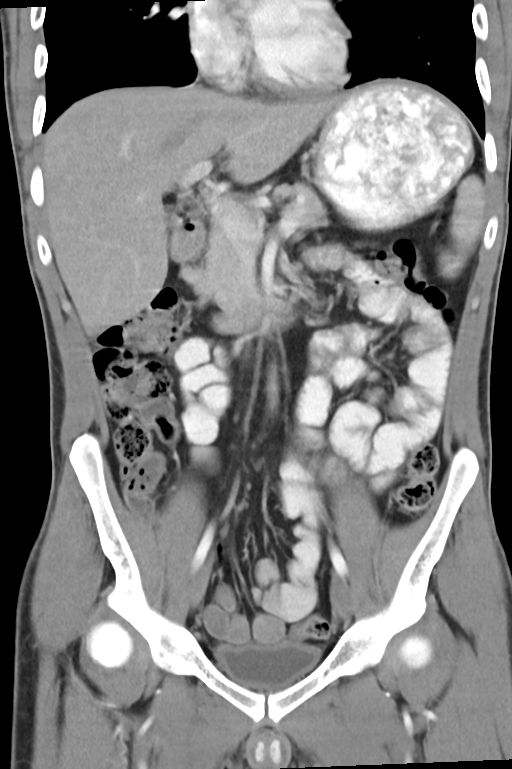
[im 21/38  soft-tissue]
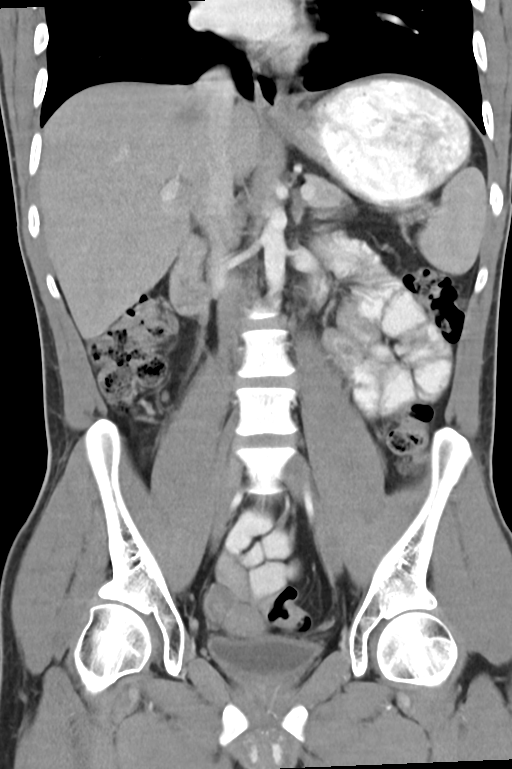

[13 of 46 positions shown; findings below may reference images not displayed]

FINDINGS: The lung bases are clear. The heart size is normal. The liver and spleen enhance appropriately without suspicious abnormality. Gallbladder, pancreas and adrenals are normal.

The kidneys uptake contrast without hydronephrosis or suspicious abnormality. A punctate calcification is seen in the inferior left renal collecting system. The urinary bladder is moderately well-distended and grossly normal.

The GI tract is not obstructed. A catheter traverses the right trunk of the chest and enters the peritoneal cavity. There is no significant free intra-peritoneal fluid. There is no extraluminal gas identified.

The retroperitoneum does not show any abnormally enlarged lymph nodes. There is no evidence of iliac or inguinal adenopathy.

The musculoskeletal system is otherwise unremarkable.
IMPRESSION: 1.
No CT evidence of acute or significant pathology. Probable ventriculoperitoneal shunt in place.

2.
Punctate left kidney stone.

Total radiation dose to patient is CTDIvol 11.29 mGy and DLP 318.10 mGy-cm.

Dose reduction technique used: Automated exposure control and adjustment of the mA and/or kV according to patient size.  CT count in previous 12-months: 0.

## 2021-10-05 IMAGING — CT CT ABD/PEL W CONT
2 of 4 series · 11 of 46 positions shown, 12 images · IV contrast (isovue)
Comparison: 02/23/21

HISTORY/INDICATION:  Periumbilical abdominal pain
TECHNIQUE: Scans of the abdomen and pelvis are performed at 5mm increments, both with oral contrast and intravenous administration of 100 mL of Isovue 300 with coronal and sagittal reconstructions.

Dose reduction technique used: Automated exposure control and adjustment of the mA and/or kV according to patient size. CT Studies and Cardiac Nuclear Medicine Studies in last 12-months = 1
Total radiation dose to patient is CTDIvol 11.40 mGy and DLP 313.10 mGy-cm.

[Series 4: soft tissue · axial · 0.43mm/px · z∈[-1734,-1354]mm · 8 of 92 slices shown, 9 images]
[im 8/92  soft-tissue]
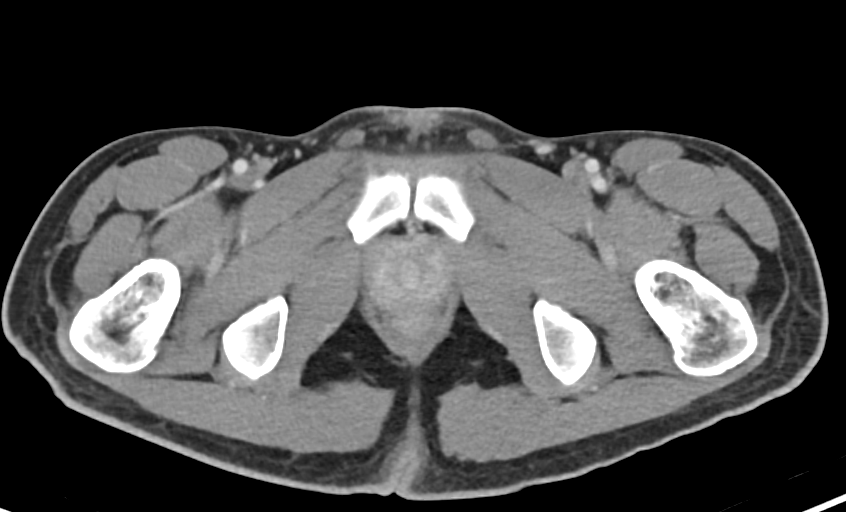
[im 8/92  bone]
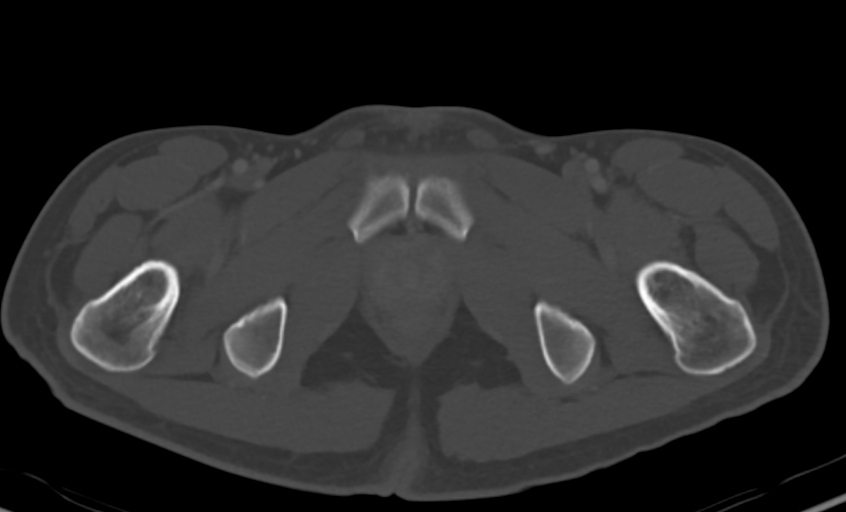
[im 19/92  soft-tissue]
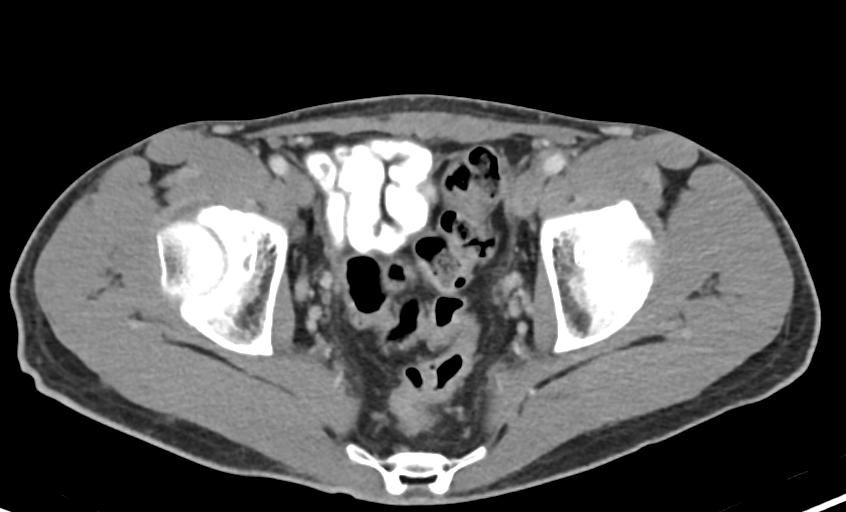
[im 31/92  soft-tissue]
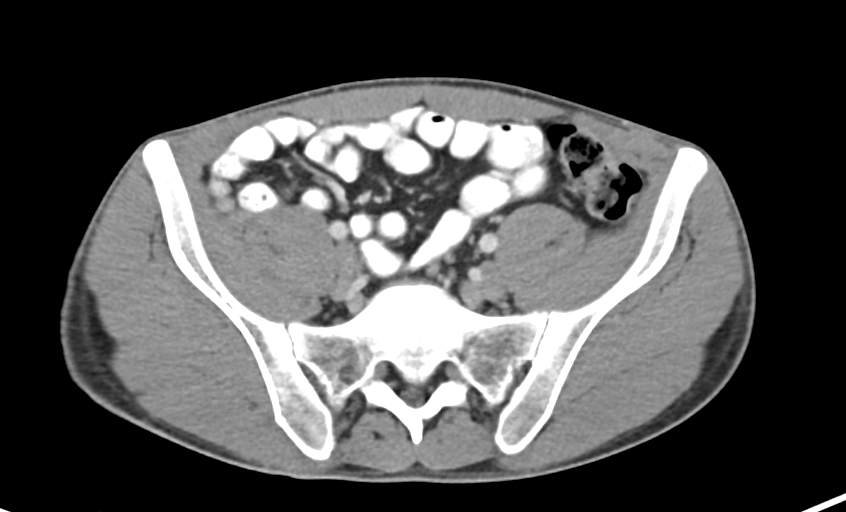
[im 42/92  soft-tissue]
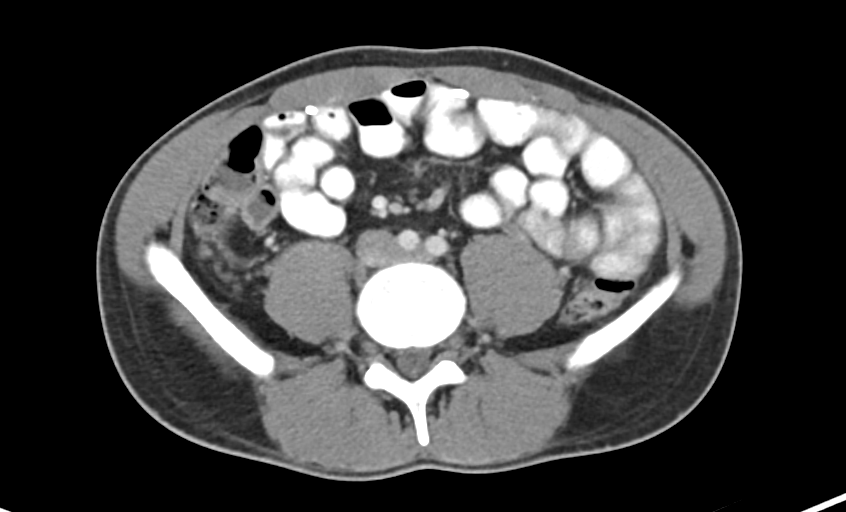
[im 50/92  soft-tissue]
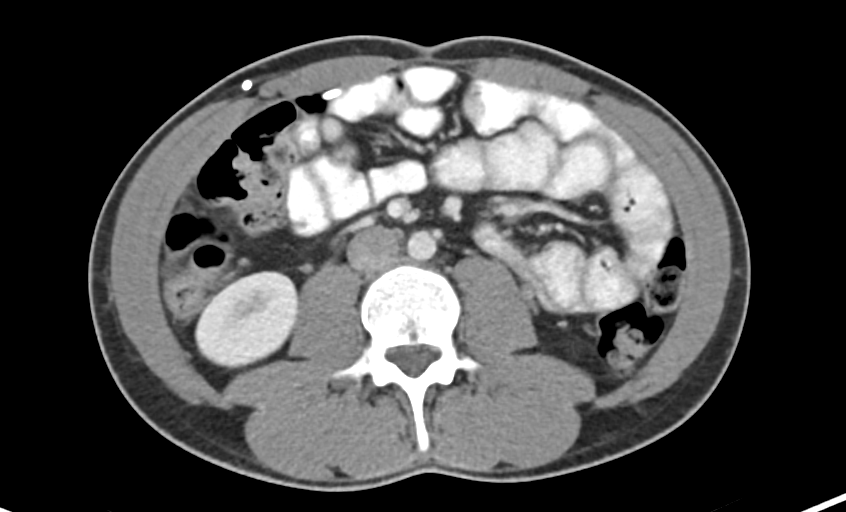
[im 61/92  soft-tissue]
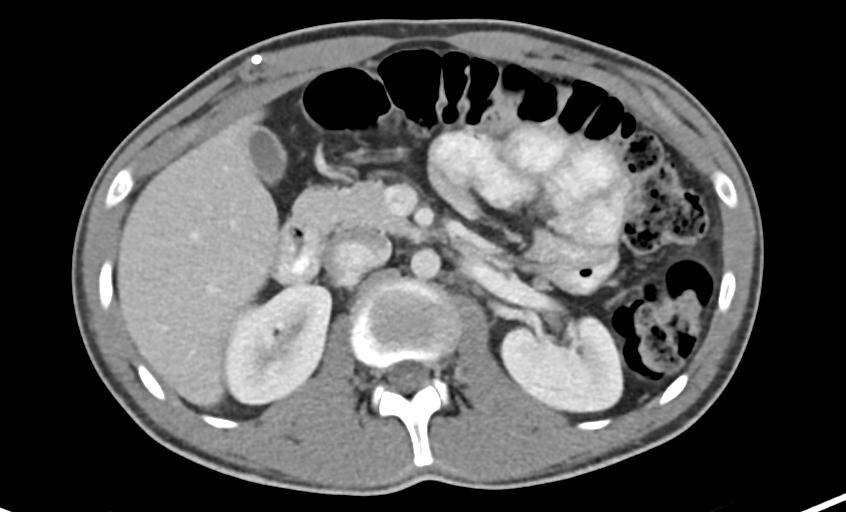
[im 73/92  soft-tissue]
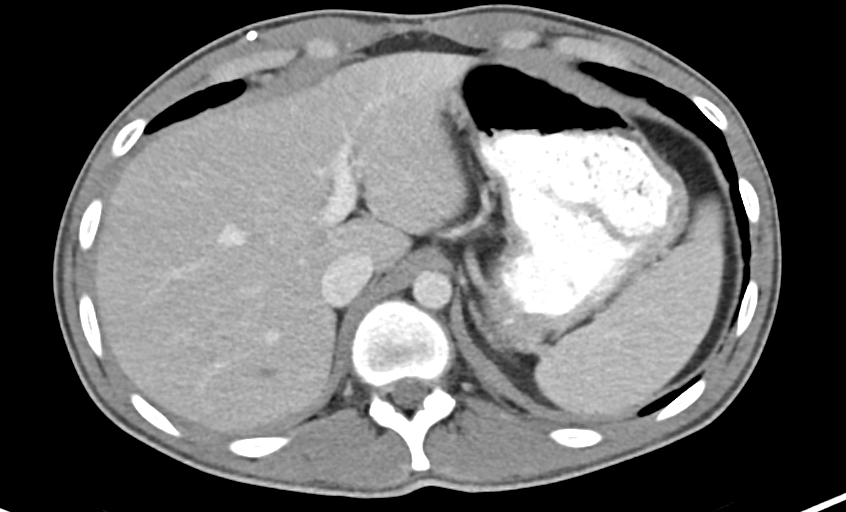
[im 84/92  soft-tissue]
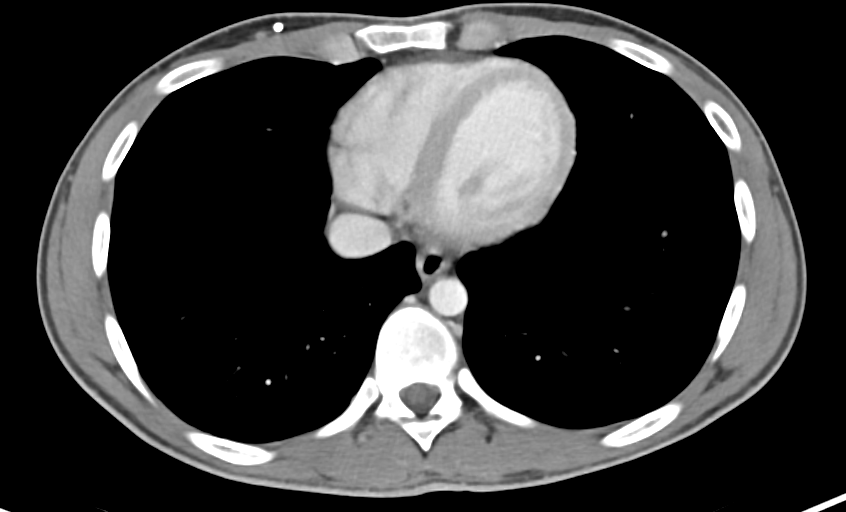

[Series 8: coronal · coronal · 0.72mm/px · 3 of 44 slices shown]
[im 15/44  soft-tissue]
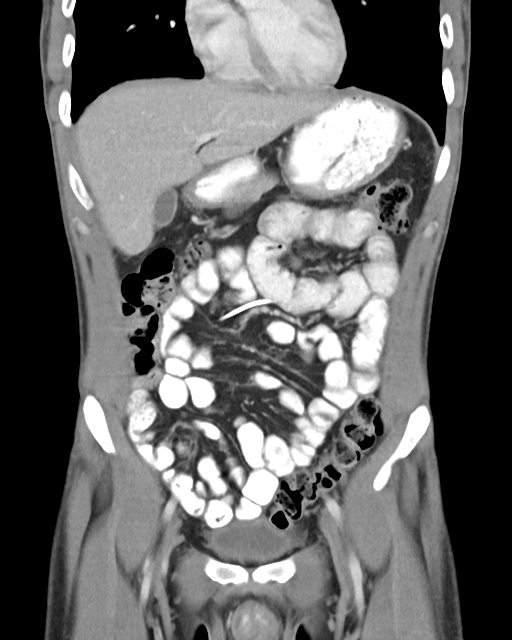
[im 20/44  soft-tissue]
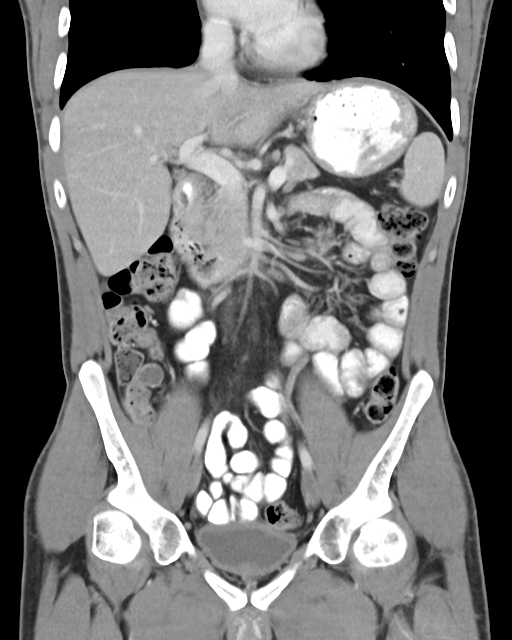
[im 24/44  soft-tissue]
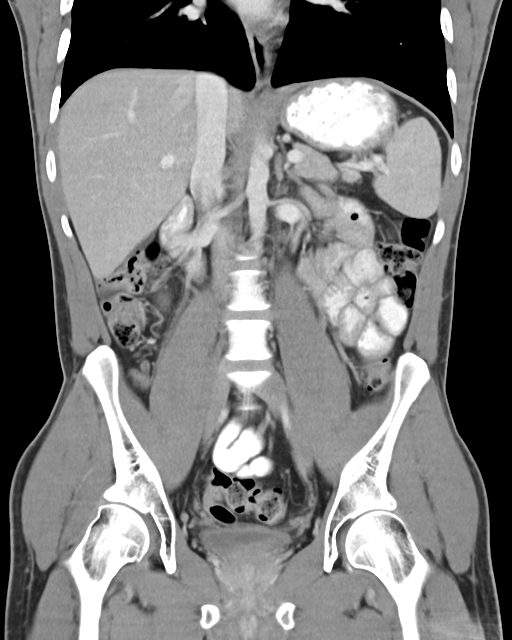

[11 of 46 positions shown; findings below may reference images not displayed]

FINDINGS: CT ABDOMEN: Lung bases are clear. Caudal margin of heart is normal. No hiatal hernia is present. The liver is normal except for 5 mm cyst in the posterior right lobe of liver ([DATE]) showing no interval change. The spleen, gallbladder and biliary system, pancreas, adrenal glands and kidneys are normal except for a 2 x 3 mm diameter stone in the lower pole collecting system of left kidney showing no interval change. No renal cyst, mass or obstruction is present. The periaortic and paracaval spaces and intraperitoneal compartment of upper abdomen are normal except for the distal end of a ventriculoperitoneal shunt tube in the upper peritoneal cavity.

CT PELVIS: Right colon, including terminal ileum and appendix are normal and the remaining colon is normal. Distal ureters, bladder, prostate gland and inguinal regions are normal. Coronal and sagittal reconstructions show no additional findings. No bone abnormality is present. Minimal dorsolumbar dextroscoliosis is again seen.
IMPRESSION: 1.
Stable small stone in lower pole of left kidney with no acute or significant findings in the abdomen and pelvis.

2.
Distal end of ventriculoperitoneal shunt tube is seen in the upper peritoneal cavity.

3.
Minimal dorsolumbar dextroscoliosis.

## 4810-02-19 DEATH — deceased
